# Patient Record
Sex: Female | Born: 1990 | Race: Black or African American | Hispanic: No | Marital: Single | Smoking: Current some day smoker
Health system: Southern US, Community
[De-identification: ages and names within clinical notes are randomized; demographics above are authoritative.]

---

## 2009-08-28 ENCOUNTER — Emergency Department (HOSPITAL_COMMUNITY): Admission: EM | Admit: 2009-08-28 | Discharge: 2009-08-28 | Payer: Self-pay | Admitting: Emergency Medicine

## 2009-11-10 ENCOUNTER — Emergency Department (HOSPITAL_COMMUNITY): Admission: EM | Admit: 2009-11-10 | Discharge: 2009-11-11 | Payer: Self-pay | Admitting: Emergency Medicine

## 2009-12-30 ENCOUNTER — Ambulatory Visit: Payer: Self-pay | Admitting: Obstetrics and Gynecology

## 2009-12-30 ENCOUNTER — Inpatient Hospital Stay (HOSPITAL_COMMUNITY): Admission: AD | Admit: 2009-12-30 | Discharge: 2009-12-30 | Payer: Self-pay | Admitting: Family Medicine

## 2010-01-03 ENCOUNTER — Inpatient Hospital Stay (HOSPITAL_COMMUNITY): Admission: AD | Admit: 2010-01-03 | Discharge: 2010-01-03 | Payer: Self-pay | Admitting: Obstetrics & Gynecology

## 2010-01-03 ENCOUNTER — Ambulatory Visit: Payer: Self-pay | Admitting: Advanced Practice Midwife

## 2010-02-02 ENCOUNTER — Inpatient Hospital Stay (HOSPITAL_COMMUNITY): Admission: RE | Admit: 2010-02-02 | Discharge: 2010-02-02 | Payer: Self-pay | Admitting: Family Medicine

## 2010-02-03 ENCOUNTER — Inpatient Hospital Stay (HOSPITAL_COMMUNITY): Admission: AD | Admit: 2010-02-03 | Discharge: 2010-02-03 | Payer: Self-pay | Admitting: Family Medicine

## 2010-02-03 ENCOUNTER — Ambulatory Visit: Payer: Self-pay | Admitting: Obstetrics and Gynecology

## 2010-02-04 ENCOUNTER — Inpatient Hospital Stay (HOSPITAL_COMMUNITY): Admission: AD | Admit: 2010-02-04 | Discharge: 2010-02-11 | Payer: Self-pay | Admitting: Obstetrics & Gynecology

## 2010-02-04 ENCOUNTER — Ambulatory Visit: Payer: Self-pay | Admitting: Advanced Practice Midwife

## 2010-09-04 LAB — CBC
HCT: 34.7 % — ABNORMAL LOW (ref 36.0–46.0)
HCT: 35.6 % — ABNORMAL LOW (ref 36.0–46.0)
Hemoglobin: 12.1 g/dL (ref 12.0–15.0)
MCH: 30 pg (ref 26.0–34.0)
MCH: 30.3 pg (ref 26.0–34.0)
MCHC: 33.5 g/dL (ref 30.0–36.0)
MCHC: 33.9 g/dL (ref 30.0–36.0)
MCV: 89.4 fL (ref 78.0–100.0)
MCV: 89.5 fL (ref 78.0–100.0)
Platelets: 264 10*3/uL (ref 150–400)
Platelets: 307 10*3/uL (ref 150–400)
RBC: 3.87 MIL/uL (ref 3.87–5.11)
RDW: 13.1 % (ref 11.5–15.5)
RDW: 13.1 % (ref 11.5–15.5)
WBC: 12.1 10*3/uL — ABNORMAL HIGH (ref 4.0–10.5)

## 2010-09-04 LAB — FETAL FIBRONECTIN: Fetal Fibronectin: POSITIVE — AB

## 2010-09-04 LAB — RPR: RPR Ser Ql: NONREACTIVE

## 2010-09-04 LAB — RUBELLA SCREEN: Rubella: 11.1 IU/mL — ABNORMAL HIGH

## 2010-09-05 LAB — URINALYSIS, ROUTINE W REFLEX MICROSCOPIC
Bilirubin Urine: NEGATIVE
Hgb urine dipstick: NEGATIVE
pH: 7 (ref 5.0–8.0)

## 2010-09-05 LAB — URINE MICROSCOPIC-ADD ON

## 2010-09-06 LAB — URINALYSIS, ROUTINE W REFLEX MICROSCOPIC
Bilirubin Urine: NEGATIVE
Glucose, UA: NEGATIVE mg/dL
Ketones, ur: 15 mg/dL — AB
Protein, ur: NEGATIVE mg/dL
Specific Gravity, Urine: 1.025 (ref 1.005–1.030)
Urobilinogen, UA: 0.2 mg/dL (ref 0.0–1.0)
pH: 6.5 (ref 5.0–8.0)

## 2010-09-06 LAB — WET PREP, GENITAL: Clue Cells Wet Prep HPF POC: NONE SEEN

## 2010-09-07 LAB — URINALYSIS, ROUTINE W REFLEX MICROSCOPIC
Glucose, UA: NEGATIVE mg/dL
Hgb urine dipstick: NEGATIVE
Leukocytes, UA: NEGATIVE
Nitrite: NEGATIVE
Specific Gravity, Urine: 1.035 — ABNORMAL HIGH (ref 1.005–1.030)
Urobilinogen, UA: 1 mg/dL (ref 0.0–1.0)
pH: 7.5 (ref 5.0–8.0)

## 2010-09-07 LAB — URINE MICROSCOPIC-ADD ON

## 2010-09-14 LAB — URINALYSIS, ROUTINE W REFLEX MICROSCOPIC
Bilirubin Urine: NEGATIVE
Glucose, UA: NEGATIVE mg/dL
Ketones, ur: NEGATIVE mg/dL
Nitrite: NEGATIVE
Specific Gravity, Urine: 1.027 (ref 1.005–1.030)
Urobilinogen, UA: 0.2 mg/dL (ref 0.0–1.0)
pH: 7 (ref 5.0–8.0)

## 2010-09-14 LAB — URINE CULTURE

## 2010-09-14 LAB — URINE MICROSCOPIC-ADD ON

## 2011-01-27 ENCOUNTER — Inpatient Hospital Stay (INDEPENDENT_AMBULATORY_CARE_PROVIDER_SITE_OTHER)
Admission: RE | Admit: 2011-01-27 | Discharge: 2011-01-27 | Disposition: A | Discharge status: Home / Self Care | Payer: Medicaid Other | Source: Ambulatory Visit | Attending: Emergency Medicine | Admitting: Emergency Medicine

## 2011-01-27 DIAGNOSIS — J069 Acute upper respiratory infection, unspecified: Secondary | ICD-10-CM

## 2011-03-24 ENCOUNTER — Emergency Department (HOSPITAL_COMMUNITY)
Admission: EM | Admit: 2011-03-24 | Discharge: 2011-03-25 | Disposition: A | Discharge status: Home / Self Care | Payer: Self-pay | Attending: Emergency Medicine | Admitting: Emergency Medicine

## 2011-03-24 DIAGNOSIS — R55 Syncope and collapse: Secondary | ICD-10-CM | POA: Insufficient documentation

## 2011-03-24 DIAGNOSIS — R42 Dizziness and giddiness: Secondary | ICD-10-CM | POA: Insufficient documentation

## 2011-03-25 ENCOUNTER — Emergency Department (HOSPITAL_COMMUNITY)
Admission: EM | Admit: 2011-03-25 | Discharge: 2011-03-25 | Disposition: A | Discharge status: Home / Self Care | Payer: Self-pay | Attending: Emergency Medicine | Admitting: Emergency Medicine

## 2011-03-25 DIAGNOSIS — J020 Streptococcal pharyngitis: Secondary | ICD-10-CM | POA: Insufficient documentation

## 2011-03-25 DIAGNOSIS — R42 Dizziness and giddiness: Secondary | ICD-10-CM | POA: Insufficient documentation

## 2011-03-25 DIAGNOSIS — J351 Hypertrophy of tonsils: Secondary | ICD-10-CM | POA: Insufficient documentation

## 2011-03-25 LAB — URINALYSIS, ROUTINE W REFLEX MICROSCOPIC
Bilirubin Urine: NEGATIVE
Bilirubin Urine: NEGATIVE
Glucose, UA: NEGATIVE mg/dL
Ketones, ur: NEGATIVE mg/dL
Nitrite: NEGATIVE
Protein, ur: NEGATIVE mg/dL
Specific Gravity, Urine: 1.028 (ref 1.005–1.030)
pH: 6 (ref 5.0–8.0)

## 2011-03-25 LAB — CBC
HCT: 38 % (ref 36.0–46.0)
Hemoglobin: 12.8 g/dL (ref 12.0–15.0)
MCH: 28.8 pg (ref 26.0–34.0)
MCV: 85.4 fL (ref 78.0–100.0)
RBC: 4.45 MIL/uL (ref 3.87–5.11)

## 2011-03-25 LAB — POCT I-STAT, CHEM 8
BUN: 13 mg/dL (ref 6–23)
Chloride: 104 mEq/L (ref 96–112)
Creatinine, Ser: 0.9 mg/dL (ref 0.50–1.10)
Sodium: 141 mEq/L (ref 135–145)
TCO2: 24 mmol/L (ref 0–100)

## 2011-03-25 LAB — URINE MICROSCOPIC-ADD ON

## 2011-03-25 LAB — POCT PREGNANCY, URINE: Preg Test, Ur: NEGATIVE

## 2011-05-31 ENCOUNTER — Emergency Department (HOSPITAL_COMMUNITY)
Admission: EM | Admit: 2011-05-31 | Discharge: 2011-05-31 | Disposition: A | Discharge status: Home / Self Care | Payer: Self-pay | Attending: Emergency Medicine | Admitting: Emergency Medicine

## 2011-05-31 ENCOUNTER — Encounter: Payer: Self-pay | Admitting: *Deleted

## 2011-05-31 DIAGNOSIS — N760 Acute vaginitis: Secondary | ICD-10-CM | POA: Insufficient documentation

## 2011-05-31 DIAGNOSIS — R10819 Abdominal tenderness, unspecified site: Secondary | ICD-10-CM | POA: Insufficient documentation

## 2011-05-31 DIAGNOSIS — A499 Bacterial infection, unspecified: Secondary | ICD-10-CM | POA: Insufficient documentation

## 2011-05-31 DIAGNOSIS — B9689 Other specified bacterial agents as the cause of diseases classified elsewhere: Secondary | ICD-10-CM | POA: Insufficient documentation

## 2011-05-31 LAB — POCT PREGNANCY, URINE: Preg Test, Ur: NEGATIVE

## 2011-05-31 LAB — URINALYSIS, ROUTINE W REFLEX MICROSCOPIC
Bilirubin Urine: NEGATIVE
Hgb urine dipstick: NEGATIVE
Nitrite: NEGATIVE
Specific Gravity, Urine: 1.017 (ref 1.005–1.030)
Urobilinogen, UA: 0.2 mg/dL (ref 0.0–1.0)
pH: 6 (ref 5.0–8.0)

## 2011-05-31 LAB — WET PREP, GENITAL
Trich, Wet Prep: NONE SEEN
Yeast Wet Prep HPF POC: NONE SEEN

## 2011-05-31 MED ORDER — ONDANSETRON HCL 4 MG PO TABS: 4.0000 mg | ORAL_TABLET | Freq: Four times a day (QID) | ORAL | Status: DC

## 2011-05-31 MED ORDER — OXYCODONE-ACETAMINOPHEN 5-325 MG PO TABS: 1.0000 | ORAL_TABLET | Freq: Four times a day (QID) | ORAL | Status: AC | PRN

## 2011-05-31 MED ORDER — ONDANSETRON 8 MG PO TBDP
8.0000 mg | ORAL_TABLET | Freq: Once | ORAL | Status: AC
  Administered 2011-05-31: 8 mg via ORAL
  Filled 2011-05-31: qty 1

## 2011-05-31 MED ORDER — METRONIDAZOLE 500 MG PO TABS: 500.0000 mg | ORAL_TABLET | Freq: Two times a day (BID) | ORAL | Status: AC

## 2011-05-31 NOTE — ED Provider Notes (Signed)
History     CSN: [id number] Arrival date & time: 05/31/2011  1:55 PM   First MD Initiated Contact with Patient 05/31/11 1644      Chief Complaint  Patient presents with  . Abdominal Pain    Pt c/o LLQ and RLQ abd pain x's 1 week. Pt also c/o lower back pain.  . Nausea    pt reports nausea, denies vomiting.     (Consider location/radiation/quality/duration/timing/severity/associated sxs/prior treatment) Patient is a 20 y.o. female presenting with abdominal pain. The history is provided by the patient.  Abdominal Pain The primary symptoms of the illness include abdominal pain and nausea. The primary symptoms of the illness do not include fever, fatigue, shortness of breath, vomiting, diarrhea, hematemesis, hematochezia, dysuria, vaginal discharge or vaginal bleeding. The current episode started yesterday. The onset of the illness was gradual. The problem has not changed since onset. The patient states that she believes she is currently not pregnant. The patient has not had a change in bowel habit. Symptoms associated with the illness do not include chills, anorexia, diaphoresis, heartburn, constipation, urgency, hematuria, frequency or back pain.   Pt presented to ED for bilateral lower quadrant pain that comes and goes with nausea without vomiting for 1 week,. History reviewed. No pertinent past medical history.  History reviewed. No pertinent past surgical history.  History reviewed. No pertinent family history.  History  Substance Use Topics  . Smoking status: Passive Smoker    Types: Cigars  . Smokeless tobacco: Not on file  . Alcohol Use:     OB History    Grav Para Term Preterm Abortions TAB SAB Ect Mult Living                  Review of Systems  Constitutional: Negative for fever, chills, diaphoresis and fatigue.  Respiratory: Negative for shortness of breath.   Gastrointestinal: Positive for nausea and abdominal pain. Negative for heartburn, vomiting, diarrhea,  constipation, hematochezia, anorexia and hematemesis.  Genitourinary: Negative for dysuria, urgency, frequency, hematuria, vaginal bleeding and vaginal discharge.  Musculoskeletal: Negative for back pain.  All other systems reviewed and are negative.    Allergies  Review of patient's allergies indicates no known allergies.  Home Medications  No current outpatient prescriptions on file.  BP 113/64  Pulse 81  Temp(Src) 98.7 F (37.1 C) (Oral)  Resp 12  Wt 165 lb (74.844 kg)  SpO2 100%  LMP 05/18/2011  Physical Exam  Nursing note and vitals reviewed. Constitutional: She appears well-developed and well-nourished.  HENT:  Head: Normocephalic and atraumatic.  Eyes: Conjunctivae are normal. Pupils are equal, round, and reactive to light.  Neck: Trachea normal, normal range of motion and full passive range of motion without pain. Neck supple.  Cardiovascular: Normal rate, regular rhythm and normal pulses.   Pulmonary/Chest: Effort normal and breath sounds normal. Chest wall is not dull to percussion. She exhibits no tenderness, no crepitus, no edema, no deformity and no retraction.  Abdominal: Soft. Normal appearance and bowel sounds are normal. She exhibits no distension. There is no tenderness. There is no guarding.  Genitourinary: Rectum normal and uterus normal. Cervix exhibits no motion tenderness, no discharge and no friability. Right adnexum displays tenderness. Right adnexum displays no mass and no fullness. Left adnexum displays tenderness. Left adnexum displays no mass and no fullness.  Musculoskeletal: Normal range of motion.  Neurological: She is alert. She has normal strength.  Skin: Skin is warm, dry and intact.  Psychiatric: She has a normal  mood and affect. Her speech is normal and behavior is normal. Judgment and thought content normal. Cognition and memory are normal.    ED Course  Procedures (including critical care time)  Labs Reviewed  WET PREP, GENITAL -  Abnormal; Notable for the following:    WBC, Wet Prep HPF POC MODERATE (*)    All other components within normal limits  URINALYSIS, ROUTINE W REFLEX MICROSCOPIC  POCT PREGNANCY, URINE  POCT PREGNANCY, URINE  GC/CHLAMYDIA PROBE AMP, GENITAL   No results found.   No diagnosis found.    MDM  Wet prep showed WBC, pt has been pain and nausea free since back in ED rooms.        Dorthula Matas, PA 05/31/11 [phone]

## 2011-05-31 NOTE — ED Notes (Signed)
Pt reports having LLQ and RLQ abdominal pain and lower back pain for approximately a week. Pt reports nausea with the pain, denies vomiting, burning on urination, vaginal discharge and vaginal odor. Pt does state that she thinks she had a UTI approximately 3 weeks ago, but doesn't think that is what she has now. Pt alert and oriented x 4, neuro intact. Pt is sexually active and states she uses protection. Pt is not on her period at this time.

## 2011-06-01 LAB — GC/CHLAMYDIA PROBE AMP, GENITAL: GC Probe Amp, Genital: NEGATIVE

## 2011-06-01 NOTE — ED Provider Notes (Signed)
Medical screening examination/treatment/procedure(s) were performed by non-physician practitioner and as supervising physician I was immediately available for consultation/collaboration.  Nicholes Stairs, MD 06/01/11 [phone]

## 2011-06-07 ENCOUNTER — Encounter (HOSPITAL_COMMUNITY): Payer: Self-pay | Admitting: Emergency Medicine

## 2011-06-07 ENCOUNTER — Emergency Department (HOSPITAL_COMMUNITY)
Admission: EM | Admit: 2011-06-07 | Discharge: 2011-06-08 | Disposition: A | Discharge status: Home / Self Care | Payer: Self-pay | Attending: Emergency Medicine | Admitting: Emergency Medicine

## 2011-06-07 ENCOUNTER — Emergency Department (HOSPITAL_COMMUNITY): Payer: Self-pay

## 2011-06-07 DIAGNOSIS — Z79899 Other long term (current) drug therapy: Secondary | ICD-10-CM | POA: Insufficient documentation

## 2011-06-07 DIAGNOSIS — R3915 Urgency of urination: Secondary | ICD-10-CM | POA: Insufficient documentation

## 2011-06-07 DIAGNOSIS — R110 Nausea: Secondary | ICD-10-CM

## 2011-06-07 DIAGNOSIS — R10819 Abdominal tenderness, unspecified site: Secondary | ICD-10-CM | POA: Insufficient documentation

## 2011-06-07 DIAGNOSIS — R112 Nausea with vomiting, unspecified: Secondary | ICD-10-CM | POA: Insufficient documentation

## 2011-06-07 DIAGNOSIS — R109 Unspecified abdominal pain: Secondary | ICD-10-CM | POA: Insufficient documentation

## 2011-06-07 DIAGNOSIS — F17200 Nicotine dependence, unspecified, uncomplicated: Secondary | ICD-10-CM | POA: Insufficient documentation

## 2011-06-07 LAB — URINE MICROSCOPIC-ADD ON

## 2011-06-07 LAB — URINALYSIS, ROUTINE W REFLEX MICROSCOPIC
Bilirubin Urine: NEGATIVE
Glucose, UA: NEGATIVE mg/dL
Hgb urine dipstick: NEGATIVE
Specific Gravity, Urine: 1.02 (ref 1.005–1.030)

## 2011-06-07 LAB — PREGNANCY, URINE: Preg Test, Ur: NEGATIVE

## 2011-06-07 MED ORDER — SODIUM CHLORIDE 0.9 % IV SOLN
INTRAVENOUS | Status: DC
  Administered 2011-06-08: 01:00:00 via INTRAVENOUS

## 2011-06-07 MED ORDER — ONDANSETRON HCL 4 MG/2ML IJ SOLN
4.0000 mg | Freq: Once | INTRAMUSCULAR | Status: AC
  Administered 2011-06-07: 4 mg via INTRAVENOUS
  Filled 2011-06-07: qty 2

## 2011-06-07 MED ORDER — SODIUM CHLORIDE 0.9 % IV BOLUS (SEPSIS)
500.0000 mL | Freq: Once | INTRAVENOUS | Status: AC
  Administered 2011-06-07: 1000 mL via INTRAVENOUS

## 2011-06-07 NOTE — ED Notes (Signed)
Patient transported to X-ray 

## 2011-06-07 NOTE — ED Provider Notes (Signed)
History     CSN: [id number] Arrival date & time: 06/07/2011  9:02 PM   First MD Initiated Contact with Patient 06/07/11 2318      Chief Complaint  Patient presents with  . Abdominal Pain    (Consider location/radiation/quality/duration/timing/severity/associated sxs/prior treatment) Patient is a 20 y.o. female presenting with abdominal pain. The history is provided by the patient.  Abdominal Pain The primary symptoms of the illness include abdominal pain, nausea and vomiting. The primary symptoms of the illness do not include fever, fatigue, shortness of breath, diarrhea, hematemesis, dysuria or vaginal discharge. The current episode started more than 2 days ago. The onset of the illness was gradual. The problem has not changed since onset. The abdominal pain radiates to the RUQ and LUQ. The severity of the abdominal pain is 6/10. The abdominal pain is relieved by nothing. The abdominal pain is exacerbated by vomiting.  Nausea began more than 1 week ago. The nausea is associated with eating. The nausea is exacerbated by food.  The vomiting began more than 2 days ago. The emesis contains stomach contents.  The patient states that she believes she is currently not pregnant. The patient has not had a change in bowel habit. Additional symptoms associated with the illness include urgency. Symptoms associated with the illness do not include chills, heartburn or frequency. Significant associated medical issues do not include PUD, GERD or inflammatory bowel disease.   She was seen here about a week ago and diagnosed with nonspecific vaginitis. She was prescribed Flagyl and Percocet but they have not helped.  History reviewed. No pertinent past medical history.  History reviewed. No pertinent past surgical history.  History reviewed. No pertinent family history.  History  Substance Use Topics  . Smoking status: Passive Smoker    Types: Cigars  . Smokeless tobacco: Not on file  . Alcohol  Use: No    OB History    Grav Para Term Preterm Abortions TAB SAB Ect Mult Living                  Review of Systems  Constitutional: Negative for fever, chills and fatigue.  Respiratory: Negative for shortness of breath.   Gastrointestinal: Positive for nausea, vomiting and abdominal pain. Negative for heartburn, diarrhea and hematemesis.  Genitourinary: Positive for urgency. Negative for dysuria, frequency and vaginal discharge.  All other systems reviewed and are negative.    Allergies  Review of patient's allergies indicates no known allergies.  Home Medications   Current Outpatient Rx  Name Route Sig Dispense Refill  . METRONIDAZOLE 500 MG PO TABS Oral Take 1 tablet (500 mg total) by mouth 2 (two) times daily. 14 tablet 0  . OXYCODONE-ACETAMINOPHEN 5-325 MG PO TABS Oral Take 1 tablet by mouth every 6 (six) hours as needed for pain. 15 tablet 0  . PROMETHAZINE HCL 25 MG PO TABS Oral Take 1 tablet (25 mg total) by mouth every 6 (six) hours as needed for nausea. 30 tablet 0    BP 108/67  Pulse 65  Temp(Src) 98.6 F (37 C) (Oral)  Resp 16  SpO2 100%  LMP 05/18/2011  Physical Exam  Constitutional: She is oriented to person, place, and time. She appears well-developed and well-nourished.  HENT:  Head: Normocephalic and atraumatic.  Eyes: Conjunctivae and EOM are normal. Pupils are equal, round, and reactive to light. Right eye exhibits no discharge. Left eye exhibits no discharge.  Neck: Normal range of motion. Neck supple.  Cardiovascular: Normal rate and regular  rhythm.   Pulmonary/Chest: Effort normal and breath sounds normal.  Abdominal: Soft. Bowel sounds are normal. She exhibits no distension. There is tenderness (Mild diffuse tenderness without localization. No mass. No hepatosplenomegaly. Bowel sounds are normal).  Musculoskeletal: Normal range of motion.  Neurological: She is alert and oriented to person, place, and time. No cranial nerve deficit. She exhibits  normal muscle tone. Coordination normal.  Skin: Skin is warm and dry.  Psychiatric: She has a normal mood and affect. Her behavior is normal. Judgment and thought content normal.    ED Course  Procedures (including critical care time)  Labs Reviewed  URINALYSIS, ROUTINE W REFLEX MICROSCOPIC - Abnormal; Notable for the following:    Leukocytes, UA SMALL (*)    All other components within normal limits  URINE MICROSCOPIC-ADD ON - Abnormal; Notable for the following:    Squamous Epithelial / LPF FEW (*)    Bacteria, UA MANY (*)    All other components within normal limits  DIFFERENTIAL - Abnormal; Notable for the following:    Neutrophils Relative 40 (*)    Lymphocytes Relative 47 (*)    All other components within normal limits  COMPREHENSIVE METABOLIC PANEL - Abnormal; Notable for the following:    Albumin 3.3 (*)    Total Bilirubin 0.2 (*)    GFR calc non Af Amer 74 (*)    GFR calc Af Amer 86 (*)    All other components within normal limits  CBC  PREGNANCY, URINE   Dg Abd Acute W/chest  06/08/2011  *RADIOLOGY REPORT*  Clinical Data: Abdominal pain, vomiting  ACUTE ABDOMEN SERIES (ABDOMEN 2 VIEW & CHEST 1 VIEW)  Comparison: None.  Findings: Lungs are clear. No pleural effusion or pneumothorax.  Cardiomediastinal silhouette is within normal limits.  Nonobstructive bowel gas pattern.  No evidence of free air under the diaphragm on the upright view.  Visualized osseous structures are within normal limits.  IMPRESSION: No evidence of acute cardiopulmonary disease.  No evidence of small bowel obstruction or free air.  Original Report Authenticated By: Charline Bills, M.D.   ED treatment: IV fluids, and Zofran. At this time. Patient is improved, and feels comfortable- 02:57  1. Abdominal pain   2. Nausea       MDM  Patient is improved with treatment given in the emergency department. Her evaluation tonight is reassuring for lack of significant intra-abdominal processes. I doubt  occult infection, colitis, metabolic instability or progressive disease.        Flint Melter, MD 06/08/11 [phone]

## 2011-06-07 NOTE — ED Notes (Signed)
Pt states she is having abd pain, back pain, headache, sore throat, nausea and vomiting, general fatigue  Sxs started over a week ago  Was seen here last Monday  Sxs continue to get worse Pt states she was given flagyl and percocet and has not been able to keep it down

## 2011-06-08 LAB — DIFFERENTIAL
Basophils Relative: 1 % (ref 0–1)
Lymphocytes Relative: 47 % — ABNORMAL HIGH (ref 12–46)
Lymphs Abs: 2.8 10*3/uL (ref 0.7–4.0)
Monocytes Relative: 10 % (ref 3–12)
Neutro Abs: 2.4 10*3/uL (ref 1.7–7.7)
Neutrophils Relative %: 40 % — ABNORMAL LOW (ref 43–77)

## 2011-06-08 LAB — COMPREHENSIVE METABOLIC PANEL
AST: 14 U/L (ref 0–37)
Albumin: 3.3 g/dL — ABNORMAL LOW (ref 3.5–5.2)
Alkaline Phosphatase: 47 U/L (ref 39–117)
BUN: 13 mg/dL (ref 6–23)
CO2: 23 mEq/L (ref 19–32)
Chloride: 106 mEq/L (ref 96–112)
GFR calc non Af Amer: 74 mL/min — ABNORMAL LOW (ref >90)
Potassium: 3.6 mEq/L (ref 3.5–5.1)
Total Bilirubin: 0.2 mg/dL — ABNORMAL LOW (ref 0.3–1.2)

## 2011-06-08 LAB — CBC
Platelets: 235 10*3/uL (ref 150–400)
RBC: 4.35 MIL/uL (ref 3.87–5.11)
RDW: 12.8 % (ref 11.5–15.5)
WBC: 5.9 10*3/uL (ref 4.0–10.5)

## 2011-06-08 MED ORDER — PROMETHAZINE HCL 25 MG PO TABS: 25.0000 mg | ORAL_TABLET | Freq: Four times a day (QID) | ORAL | Status: DC | PRN

## 2011-06-08 NOTE — ED Notes (Signed)
Lab stated that they are unable to find blood specimen. Lab tech will draw additional blood.

## 2011-06-08 NOTE — ED Notes (Signed)
Pt denies nausea at this time. Resting quietly.

## 2011-06-08 NOTE — ED Notes (Signed)
Repeat blood work drawn and sent to lab.

## 2011-06-17 ENCOUNTER — Emergency Department (HOSPITAL_COMMUNITY)
Admission: EM | Admit: 2011-06-17 | Discharge: 2011-06-17 | Disposition: A | Discharge status: Home / Self Care | Payer: Self-pay | Attending: Emergency Medicine | Admitting: Emergency Medicine

## 2011-06-17 ENCOUNTER — Encounter (HOSPITAL_COMMUNITY): Payer: Self-pay | Admitting: *Deleted

## 2011-06-17 DIAGNOSIS — R5383 Other fatigue: Secondary | ICD-10-CM | POA: Insufficient documentation

## 2011-06-17 DIAGNOSIS — R05 Cough: Secondary | ICD-10-CM | POA: Insufficient documentation

## 2011-06-17 DIAGNOSIS — E860 Dehydration: Secondary | ICD-10-CM | POA: Insufficient documentation

## 2011-06-17 DIAGNOSIS — R112 Nausea with vomiting, unspecified: Secondary | ICD-10-CM | POA: Insufficient documentation

## 2011-06-17 DIAGNOSIS — R197 Diarrhea, unspecified: Secondary | ICD-10-CM | POA: Insufficient documentation

## 2011-06-17 DIAGNOSIS — R059 Cough, unspecified: Secondary | ICD-10-CM | POA: Insufficient documentation

## 2011-06-17 DIAGNOSIS — IMO0001 Reserved for inherently not codable concepts without codable children: Secondary | ICD-10-CM | POA: Insufficient documentation

## 2011-06-17 DIAGNOSIS — R509 Fever, unspecified: Secondary | ICD-10-CM | POA: Insufficient documentation

## 2011-06-17 DIAGNOSIS — R5381 Other malaise: Secondary | ICD-10-CM | POA: Insufficient documentation

## 2011-06-17 LAB — CBC
HCT: 39.9 % (ref 36.0–46.0)
MCHC: 34.6 g/dL (ref 30.0–36.0)
MCV: 84.4 fL (ref 78.0–100.0)
RDW: 12.6 % (ref 11.5–15.5)

## 2011-06-17 LAB — DIFFERENTIAL
Basophils Absolute: 0 10*3/uL (ref 0.0–0.1)
Basophils Relative: 0 % (ref 0–1)
Eosinophils Relative: 1 % (ref 0–5)
Monocytes Absolute: 0.7 10*3/uL (ref 0.1–1.0)

## 2011-06-17 LAB — POCT I-STAT, CHEM 8
Calcium, Ion: 1.1 mmol/L — ABNORMAL LOW (ref 1.12–1.32)
HCT: 42 % (ref 36.0–46.0)
Hemoglobin: 14.3 g/dL (ref 12.0–15.0)
TCO2: 22 mmol/L (ref 0–100)

## 2011-06-17 MED ORDER — ONDANSETRON HCL 4 MG/2ML IJ SOLN
4.0000 mg | Freq: Once | INTRAMUSCULAR | Status: AC
  Administered 2011-06-17: 4 mg via INTRAVENOUS
  Filled 2011-06-17: qty 2

## 2011-06-17 MED ORDER — MORPHINE SULFATE 4 MG/ML IJ SOLN
4.0000 mg | Freq: Once | INTRAMUSCULAR | Status: AC
  Administered 2011-06-17: 4 mg via INTRAVENOUS
  Filled 2011-06-17: qty 1

## 2011-06-17 MED ORDER — HYDROCODONE-ACETAMINOPHEN 5-325 MG PO TABS: 1.0000 | ORAL_TABLET | Freq: Four times a day (QID) | ORAL | Status: AC | PRN

## 2011-06-17 MED ORDER — SODIUM CHLORIDE 0.9 % IV BOLUS (SEPSIS)
1000.0000 mL | Freq: Once | INTRAVENOUS | Status: AC
  Administered 2011-06-17: 1000 mL via INTRAVENOUS

## 2011-06-17 MED ORDER — ONDANSETRON 4 MG PO TBDP: 4.0000 mg | ORAL_TABLET | Freq: Four times a day (QID) | ORAL | Status: AC | PRN

## 2011-06-17 NOTE — ED Provider Notes (Signed)
History     CSN: [id number]  Arrival date & time 06/17/11  1306   First MD Initiated Contact with Patient 06/17/11 1347      Chief Complaint  Patient presents with  . Generalized Body Aches  . Sore Throat    (Consider location/radiation/quality/duration/timing/severity/associated sxs/prior treatment) Patient is a 20 y.o. female presenting with vomiting and diarrhea. The history is provided by the patient.  Emesis  This is a new problem. The current episode started more than 2 days ago. The problem occurs 5 to 10 times per day. The problem has not changed since onset.The emesis has an appearance of stomach contents and bilious material. The maximum temperature recorded prior to her arrival was 100 to 100.9 F. Associated symptoms include abdominal pain (cramping ), chills, cough, diarrhea, a fever, headaches, myalgias and sweats. Pertinent negatives include no arthralgias and no URI.  Diarrhea The primary symptoms include fever, fatigue, abdominal pain (cramping ), nausea, vomiting, diarrhea and myalgias. Primary symptoms do not include weight loss, melena, hematemesis, jaundice, hematochezia, dysuria, arthralgias or rash. The illness began 3 to 5 days ago. The onset was gradual. The problem has not changed since onset. The illness is also significant for chills. Associated medical issues do not include inflammatory bowel disease, GERD, gallstones, liver disease, alcohol abuse, PUD, gastric bypass, bowel resection, irritable bowel syndrome, hemorrhoids or diverticulitis.    History reviewed. No pertinent past medical history.  History reviewed. No pertinent past surgical history.  No family history on file.  History  Substance Use Topics  . Smoking status: Passive Smoker    Types: Cigars  . Smokeless tobacco: Not on file  . Alcohol Use: No    OB History    Grav Para Term Preterm Abortions TAB SAB Ect Mult Living                  Review of Systems  Constitutional: Positive  for fever, chills and fatigue. Negative for weight loss.  Respiratory: Positive for cough.   Gastrointestinal: Positive for nausea, vomiting, abdominal pain (cramping ) and diarrhea. Negative for melena, hematochezia, hematemesis and jaundice.  Genitourinary: Negative for dysuria.  Musculoskeletal: Positive for myalgias. Negative for arthralgias.  Skin: Negative for rash.  Neurological: Positive for headaches.    Allergies  Review of patient's allergies indicates no known allergies.  Home Medications   Current Outpatient Rx  Name Route Sig Dispense Refill  . OXYCODONE-ACETAMINOPHEN 5-325 MG PO TABS Oral Take 1 tablet by mouth every 4 (four) hours as needed. PAIN     . PROMETHAZINE HCL 25 MG PO TABS Oral Take 25 mg by mouth every 6 (six) hours as needed. NAUSEA       BP 100/59  Pulse 105  Temp(Src) 100.2 F (37.9 C) (Oral)  Resp 18  Ht 5\' 3"  (1.6 m)  Wt 165 lb (74.844 kg)  BMI 29.23 kg/m2  SpO2 100%  LMP 05/18/2011  Physical Exam  ED Course  Procedures (including critical care time)  Labs Reviewed  DIFFERENTIAL - Abnormal; Notable for the following:    Neutrophils Relative 84 (*)    Lymphocytes Relative 7 (*)    Lymphs Abs 0.6 (*)    All other components within normal limits  POCT I-STAT, CHEM 8 - Abnormal; Notable for the following:    Calcium, Ion 1.10 (*)    All other components within normal limits  CBC  I-STAT, CHEM 8   No results found.   No diagnosis found.    MDM  N/V/D  Patient rehydrated while in emergency department.  She states that she's not having any current abdominal pain.  Vital signs reviewed and is hemodynamically stable, in no acute distress, and agrees with plan to discharge home.  Patient states she will find a primary care physician to followup with and requests a resource guide upon discharge.        [place], [place] 06/17/11 [phone]

## 2011-06-17 NOTE — ED Notes (Signed)
Pt is to finish na bolus before d/c

## 2011-06-17 NOTE — ED Notes (Signed)
pts states she started to have sore throat and generalized body aches about 3 days ago. Pt states she is unable to keep po's down and diarrhea.

## 2011-06-17 NOTE — ED Provider Notes (Signed)
Medical screening examination/treatment/procedure(s) were performed by non-physician practitioner and as supervising physician I was immediately available for consultation/collaboration.   Dayton Bailiff, MD 06/17/11 1534

## 2011-06-22 ENCOUNTER — Encounter (HOSPITAL_COMMUNITY): Payer: Self-pay | Admitting: *Deleted

## 2011-06-22 ENCOUNTER — Emergency Department (INDEPENDENT_AMBULATORY_CARE_PROVIDER_SITE_OTHER)
Admission: EM | Admit: 2011-06-22 | Discharge: 2011-06-22 | Disposition: A | Discharge status: Home / Self Care | Payer: Self-pay | Source: Home / Self Care | Attending: Emergency Medicine | Admitting: Emergency Medicine

## 2011-06-22 DIAGNOSIS — E860 Dehydration: Secondary | ICD-10-CM

## 2011-06-22 DIAGNOSIS — J020 Streptococcal pharyngitis: Secondary | ICD-10-CM

## 2011-06-22 LAB — POCT RAPID STREP A: Streptococcus, Group A Screen (Direct): POSITIVE — AB

## 2011-06-22 MED ORDER — IBUPROFEN 600 MG PO TABS: 600.0000 mg | ORAL_TABLET | Freq: Four times a day (QID) | ORAL | Status: AC | PRN

## 2011-06-22 MED ORDER — ACETAMINOPHEN 325 MG PO TABS
ORAL_TABLET | ORAL | Status: AC
  Filled 2011-06-22: qty 2

## 2011-06-22 MED ORDER — ACETAMINOPHEN 325 MG PO TABS
650.0000 mg | ORAL_TABLET | Freq: Once | ORAL | Status: AC
  Administered 2011-06-22: 650 mg via ORAL

## 2011-06-22 MED ORDER — ONDANSETRON 4 MG PO TBDP
ORAL_TABLET | ORAL | Status: AC
  Filled 2011-06-22: qty 1

## 2011-06-22 MED ORDER — SUCRALFATE 1 GM/10ML PO SUSP: 1.0000 g | Freq: Four times a day (QID) | ORAL | Status: AC

## 2011-06-22 MED ORDER — ONDANSETRON 4 MG PO TBDP
4.0000 mg | ORAL_TABLET | Freq: Once | ORAL | Status: AC
  Administered 2011-06-22: 4 mg via ORAL

## 2011-06-22 MED ORDER — GI COCKTAIL ~~LOC~~
30.0000 mL | Freq: Once | ORAL | Status: AC
  Administered 2011-06-22: 30 mL via ORAL

## 2011-06-22 MED ORDER — DEXAMETHASONE SODIUM PHOSPHATE 10 MG/ML IJ SOLN
INTRAMUSCULAR | Status: AC
  Filled 2011-06-22: qty 1

## 2011-06-22 MED ORDER — GI COCKTAIL ~~LOC~~
ORAL | Status: AC
  Filled 2011-06-22: qty 30

## 2011-06-22 MED ORDER — FIRST-DUKES MOUTHWASH MT SUSP: 10.0000 mL | Freq: Four times a day (QID) | OROMUCOSAL | Status: DC | PRN

## 2011-06-22 MED ORDER — PENICILLIN G BENZATHINE 1200000 UNIT/2ML IM SUSP
INTRAMUSCULAR | Status: AC
  Filled 2011-06-22: qty 2

## 2011-06-22 MED ORDER — PENICILLIN G BENZATHINE 1200000 UNIT/2ML IM SUSP
1.2000 10*6.[IU] | Freq: Once | INTRAMUSCULAR | Status: AC
  Administered 2011-06-22: 1.2 10*6.[IU] via INTRAMUSCULAR

## 2011-06-22 MED ORDER — DEXAMETHASONE SODIUM PHOSPHATE 10 MG/ML IJ SOLN
10.0000 mg | Freq: Once | INTRAMUSCULAR | Status: AC
  Administered 2011-06-22: 10 mg via INTRAMUSCULAR

## 2011-06-22 NOTE — ED Notes (Signed)
Courtney Salinas  HAS  SYMPTOMS  OF  FEVER         BODY  ACHES    SORES  IN  MOUTH   FOR  ABOUT  1  WEEK  SHE  REPORTS  WAS  SEEN LAST  WEEK IN ER  FOR  FLU          SHE  REPORTS  PAIN ON SWALLOWING     ACHES    AND    VOMITING

## 2011-06-22 NOTE — ED Provider Notes (Addendum)
History     CSN: [id number]  Arrival date & time 06/22/11  1314   First MD Initiated Contact with Patient 06/22/11 1341      Chief Complaint  Patient presents with  . Fever   HPI Comments: Pt with severe ST x 2 weeks, states has gotten worse over past few days. Seen 3x in ED this month, first visit for lower abd pain which has resolved. Past 2 visits for abd pain, ST, N/V, bodyaches. Was slightly dehydrated and with low grade temp on most recent visit on 12/27, thought to have viral syndrome, bloodwork was wnl, sent home with zofran and norco.  Pt c/o continued fevers at home tmax 102 and worsening ST.  c/o continued LUQ pain with vomiting - denies any other abd pain- N/V 1-2x/day and a diffuse, constant HA that temporarily improved with IVF.Marland Kitchen States has been unable to eat or drink anything and HA has returned. C/o lightheadedness. No ear pain, sinus pain, rhinorrhea. No known sick contacts. Denies rash elsewhere, vaginal d/c. No h/o HIV, herpes.  Patient is a 21 y.o. female presenting with fever. The history is provided by the patient.  Fever Primary symptoms of the febrile illness include fever, fatigue, nausea and vomiting. Primary symptoms do not include diarrhea or rash.    History reviewed. No pertinent past medical history.  History reviewed. No pertinent past surgical history.  No family history on file.  History  Substance Use Topics  . Smoking status: Passive Smoker    Types: Cigars  . Smokeless tobacco: Not on file  . Alcohol Use: No    OB History    Grav Para Term Preterm Abortions TAB SAB Ect Mult Living                  Review of Systems  Constitutional: Positive for fever, chills and fatigue.  HENT: Positive for sore throat and trouble swallowing. Negative for voice change.   Respiratory: Negative.   Cardiovascular: Negative.   Gastrointestinal: Positive for nausea and vomiting. Negative for diarrhea and constipation.  Genitourinary: Negative.   Skin:  Negative for rash.  Neurological: Positive for weakness.    Allergies  Review of patient's allergies indicates no known allergies.  Home Medications   Current Outpatient Rx  Name Route Sig Dispense Refill  . ONDANSETRON 4 MG PO TBDP Oral Take 1 tablet (4 mg total) by mouth every 6 (six) hours as needed for nausea. 6 tablet 0  . PROMETHAZINE HCL 25 MG PO TABS Oral Take 25 mg by mouth every 6 (six) hours as needed. NAUSEA     . FIRST-DUKES MOUTHWASH MT SUSP Mouth/Throat Use as directed 10 mLs in the mouth or throat 4 (four) times daily as needed. Swish and spit do not swallow 237 mL 0  . HYDROCODONE-ACETAMINOPHEN 5-325 MG PO TABS Oral Take 1 tablet by mouth every 6 (six) hours as needed for pain. 15 tablet 0  . IBUPROFEN 600 MG PO TABS Oral Take 1 tablet (600 mg total) by mouth every 6 (six) hours as needed for pain. 30 tablet 0  . SUCRALFATE 1 GM/10ML PO SUSP Oral Take 10 mLs (1 g total) by mouth 4 (four) times daily. 240 mL 0    BP 108/68  Pulse 108  Temp(Src) 99.8 F (37.7 C) (Oral)  Resp 16  SpO2 99%  LMP 06/15/2011  Filed Vitals:   06/22/11 1448 06/22/11 1550  BP: 88/56 108/68  Pulse: 103 108  Temp: 101.6 F (38.7 C) 99.8  F (37.7 C)  TempSrc: Oral Oral  Resp: 16   SpO2: 100% 99%     Physical Exam  Nursing note and vitals reviewed. Constitutional: She is oriented to person, place, and time. She appears well-developed and well-nourished. She appears distressed.       Appears ill  HENT:  Head: Normocephalic and atraumatic. No trismus in the jaw.  Nose: Nose normal.  Mouth/Throat: Uvula is midline. Mucous membranes are dry. Oropharyngeal exudate, posterior oropharyngeal edema and posterior oropharyngeal erythema present.       Enlarged erythematous tonsils with extensive shaggy grey exudates. Slightly muffled voice. Airway patent  Eyes: Conjunctivae and EOM are normal.  Neck: Normal range of motion.  Cardiovascular: Regular rhythm, normal heart sounds and normal  pulses.  Tachycardia present.        CR < 2 sec. No tenting  Pulmonary/Chest: Effort normal and breath sounds normal.  Abdominal: Soft. Bowel sounds are normal. She exhibits no distension. There is no splenomegaly. There is tenderness in the left upper quadrant. There is no rebound, no guarding and no CVA tenderness.  Musculoskeletal: Normal range of motion.  Lymphadenopathy:    She has cervical adenopathy.  Neurological: She is alert and oriented to person, place, and time.  Skin: Skin is warm and dry.  Psychiatric: She has a normal mood and affect. Her behavior is normal. Judgment and thought content normal.    ED Course  Procedures (including critical care time)  Labs Reviewed  POCT RAPID STREP A ([facility] ONLY) - Abnormal; Notable for the following:    Streptococcus, Group A Screen (Direct) POSITIVE (*)    All other components within normal limits  POCT INFECTIOUS MONO SCREEN  LAB REPORT - SCANNED   No results found.   1. Strep pharyngitis   2. Dehydration       MDM  Previous chart, labs, imaging extensively reviewed. Pt states that abd pain today primarily in LUQ and present only when vomiting. Describes abd pain as "soreness" no other abd pain. abd tender gastric area. No other abd tenderness. Diarrhea has resolved. States has ad extremely poor po intake for past 5-6 days due to ST. Pt denies HIV, herpes RF and declined testing.    The patient was given the following meds in the [facility]:   Medications        ondansetron (ZOFRAN-ODT) disintegrating tablet 4 mg (4 mg Oral Given 06/22/11 1507)  gi cocktail (30 mL Oral Given 06/22/11 1507)  dexamethasone (DECADRON) injection 10 mg (10 mg Intramuscular Given 06/22/11 1513)  acetaminophen (TYLENOL) tablet 650 mg (650 mg Oral Given 06/22/11 1509)  penicillin g benzathine (BICILLIN LA) 1200000 UNIT/2ML injection 1.2 Million Units (1.2 Million Units Intramuscular Given 06/22/11 1620)     And had the following response: On  re-evaluation, Much improved. Tolerating po. Fever trending down, and is now normotensive on repeat VS. Discussed lab results with patient/parent. Pt has had ST for 2 weeks. Has severe pharyngitis here. Pt and parent do not want to go to ED for IVF. Wants to try oral rehydration at home. Agreed to return to ED if she is not getting better, or is unable to keep fluids down.  Will refer to ENT for re-eval and possible tonsillar removal. Dr. Annalee Genta on call.  Emphasized importance of f/u. Pt/parent agrees.    Luiz Blare, MD 06/23/11 1610  Luiz Blare, MD 06/26/11 1816

## 2011-07-29 ENCOUNTER — Emergency Department (HOSPITAL_COMMUNITY)
Admission: EM | Admit: 2011-07-29 | Discharge: 2011-07-30 | Disposition: A | Discharge status: Home / Self Care | Payer: Self-pay | Attending: Emergency Medicine | Admitting: Emergency Medicine

## 2011-07-29 ENCOUNTER — Encounter (HOSPITAL_COMMUNITY): Payer: Self-pay | Admitting: *Deleted

## 2011-07-29 DIAGNOSIS — R109 Unspecified abdominal pain: Secondary | ICD-10-CM | POA: Insufficient documentation

## 2011-07-29 DIAGNOSIS — M549 Dorsalgia, unspecified: Secondary | ICD-10-CM | POA: Insufficient documentation

## 2011-07-29 DIAGNOSIS — K529 Noninfective gastroenteritis and colitis, unspecified: Secondary | ICD-10-CM

## 2011-07-29 DIAGNOSIS — R110 Nausea: Secondary | ICD-10-CM | POA: Insufficient documentation

## 2011-07-29 DIAGNOSIS — K5289 Other specified noninfective gastroenteritis and colitis: Secondary | ICD-10-CM | POA: Insufficient documentation

## 2011-07-29 LAB — DIFFERENTIAL
Basophils Absolute: 0 10*3/uL (ref 0.0–0.1)
Lymphocytes Relative: 13 % (ref 12–46)
Neutro Abs: 6.5 10*3/uL (ref 1.7–7.7)
Neutrophils Relative %: 78 % — ABNORMAL HIGH (ref 43–77)

## 2011-07-29 LAB — POCT I-STAT, CHEM 8
BUN: 12 mg/dL (ref 6–23)
Calcium, Ion: 1.17 mmol/L (ref 1.12–1.32)
HCT: 42 % (ref 36.0–46.0)
TCO2: 26 mmol/L (ref 0–100)

## 2011-07-29 LAB — URINALYSIS, ROUTINE W REFLEX MICROSCOPIC
Bilirubin Urine: NEGATIVE
Hgb urine dipstick: NEGATIVE
Protein, ur: NEGATIVE mg/dL
Urobilinogen, UA: 0.2 mg/dL (ref 0.0–1.0)

## 2011-07-29 LAB — URINE MICROSCOPIC-ADD ON

## 2011-07-29 LAB — CBC
Platelets: 330 10*3/uL (ref 150–400)
RDW: 12.9 % (ref 11.5–15.5)
WBC: 8.3 10*3/uL (ref 4.0–10.5)

## 2011-07-29 MED ORDER — SODIUM CHLORIDE 0.9 % IV BOLUS (SEPSIS)
1000.0000 mL | Freq: Once | INTRAVENOUS | Status: AC
  Administered 2011-07-29: 1000 mL via INTRAVENOUS

## 2011-07-29 MED ORDER — ONDANSETRON HCL 4 MG/2ML IJ SOLN
4.0000 mg | Freq: Once | INTRAMUSCULAR | Status: AC
  Administered 2011-07-29: 4 mg via INTRAVENOUS
  Filled 2011-07-29: qty 2

## 2011-07-29 NOTE — ED Provider Notes (Signed)
History     CSN: [id number]  Arrival date & time 07/29/11  1757   First MD Initiated Contact with Patient 07/29/11 2101      Chief Complaint  Patient presents with  . Abdominal Pain  . Back Pain  . Nausea    (Consider location/radiation/quality/duration/timing/severity/associated sxs/prior treatment) Patient is a 21 y.o. female presenting with abdominal pain and back pain. The history is provided by the patient.  Abdominal Pain The primary symptoms of the illness include abdominal pain. The primary symptoms of the illness do not include fever, shortness of breath, nausea, vomiting, diarrhea, dysuria or vaginal discharge. The onset of the illness was sudden.  The patient states that she believes she is currently not pregnant. The patient has had a change in bowel habit. Additional symptoms associated with the illness include back pain. Symptoms associated with the illness do not include chills, constipation, urgency or frequency.  Back Pain  Associated symptoms include abdominal pain. Pertinent negatives include no chest pain, no fever, no dysuria and no weakness.    History reviewed. No pertinent past medical history.  History reviewed. No pertinent past surgical history.  No family history on file.  History  Substance Use Topics  . Smoking status: Passive Smoker    Types: Cigars  . Smokeless tobacco: Not on file  . Alcohol Use: No    OB History    Grav Para Term Preterm Abortions TAB SAB Ect Mult Living                  Review of Systems  Constitutional: Negative for fever and chills.  Respiratory: Negative for shortness of breath.   Cardiovascular: Negative for chest pain.  Gastrointestinal: Positive for abdominal pain. Negative for nausea, vomiting, diarrhea and constipation.  Genitourinary: Negative for dysuria, urgency, frequency, decreased urine volume and vaginal discharge.  Musculoskeletal: Positive for back pain. Negative for myalgias.  Skin: Negative for  color change.  Neurological: Negative for dizziness and weakness.    Allergies  Review of patient's allergies indicates no known allergies.  Home Medications   Current Outpatient Rx  Name Route Sig Dispense Refill  . ACETAMINOPHEN 500 MG PO TABS Oral Take 1,000 mg by mouth every 6 (six) hours as needed. For pain.      BP 107/69  Pulse 95  Temp(Src) 98.7 F (37.1 C) (Oral)  Resp 18  Ht 5\' 2"  (1.575 m)  Wt 165 lb (74.844 kg)  BMI 30.18 kg/m2  SpO2 100%  LMP 07/24/2011  Physical Exam  Constitutional: She appears well-developed and well-nourished.  HENT:  Head: Normocephalic.  Eyes: Pupils are equal, round, and reactive to light.  Neck: Normal range of motion.  Cardiovascular: Normal rate.   Pulmonary/Chest: Effort normal.  Abdominal: Soft. Bowel sounds are normal. She exhibits no distension. There is tenderness in the epigastric area and suprapubic area. There is no rebound.    Skin: Skin is warm and dry.  Psychiatric: She has a normal mood and affect.    ED Course  Procedures (including critical care time)  Labs Reviewed  URINALYSIS, ROUTINE W REFLEX MICROSCOPIC - Abnormal; Notable for the following:    APPearance CLOUDY (*)    Leukocytes, UA SMALL (*)    All other components within normal limits  DIFFERENTIAL - Abnormal; Notable for the following:    Neutrophils Relative 78 (*)    All other components within normal limits  URINE MICROSCOPIC-ADD ON - Abnormal; Notable for the following:    Squamous Epithelial /  LPF FEW (*)    Bacteria, UA FEW (*)    All other components within normal limits  POCT PREGNANCY, URINE  CBC  POCT I-STAT, CHEM 8   No results found.   1. Gastroenteritis       MDM  Vague abdominal pain, with one episode of vomiting today about 1:00 reports diarrhea on Tuesday, but none on Wednesday or Thursday to legs and patient to the emergency department due to her inability to find her Medicaid card        Arman Filter,  NP 07/29/11 2313  Arman Filter, NP 07/29/11 2313

## 2011-07-29 NOTE — ED Notes (Signed)
Pt unable to void @ this time

## 2011-07-29 NOTE — ED Notes (Signed)
Pt states "I've been having stomach pain, mid back pain, nausea & I vomited x 1 today"

## 2011-07-30 NOTE — ED Provider Notes (Signed)
Medical screening examination/treatment/procedure(s) were performed by non-physician practitioner and as supervising physician I was immediately available for consultation/collaboration.  Marrietta Thunder T Laureano Hetzer, MD 07/30/11 1111 

## 2011-09-09 ENCOUNTER — Emergency Department (HOSPITAL_COMMUNITY)
Admission: EM | Admit: 2011-09-09 | Discharge: 2011-09-09 | Disposition: A | Discharge status: Home / Self Care | Payer: Self-pay | Attending: Emergency Medicine | Admitting: Emergency Medicine

## 2011-09-09 ENCOUNTER — Encounter (HOSPITAL_COMMUNITY): Payer: Self-pay | Admitting: *Deleted

## 2011-09-09 DIAGNOSIS — N63 Unspecified lump in unspecified breast: Secondary | ICD-10-CM

## 2011-09-09 DIAGNOSIS — R928 Other abnormal and inconclusive findings on diagnostic imaging of breast: Secondary | ICD-10-CM | POA: Insufficient documentation

## 2011-09-09 NOTE — ED Provider Notes (Signed)
History     CSN: [id number]  Arrival date & time 09/09/11  1053   First MD Initiated Contact with Patient 09/09/11 1130      No chief complaint on file.   (Consider location/radiation/quality/duration/timing/severity/associated sxs/prior treatment) HPI  21 year old female presents with a chief complaints of skin changes. Patient states she has been noticing a knot underneath the right breast. She notices knot when laying down one night.  Knot is persistent, remain the same size, with mild tenderness.  Patient noticed a knot more when she lies flat. Initially she thought it was resulting from her bra wire, however knot remains despite changes in bra. Denies redness or discharge.  Patient denies fever, chest pain, shortness of breath, discharge, bleeding, numbness, or rash. She denies any specific trauma. She denies taking birth control pill. She decides to come here today at the recommendation of her mom.  Denies significant cancer history. LMP 3 weeks ago.  Denies weight loss, fever, body aches, diaphoresis, or hemoptysis.  No past medical history on file.  No past surgical history on file.  No family history on file.  History  Substance Use Topics  . Smoking status: Passive Smoker    Types: Cigars  . Smokeless tobacco: Not on file  . Alcohol Use: No    OB History    Grav Para Term Preterm Abortions TAB SAB Ect Mult Living                  Review of Systems  All other systems reviewed and are negative.    Allergies  Review of patient's allergies indicates no known allergies.  Home Medications   Current Outpatient Rx  Name Route Sig Dispense Refill  . ACETAMINOPHEN 500 MG PO TABS Oral Take 1,000 mg by mouth every 6 (six) hours as needed. For pain.    . ADULT MULTIVITAMIN W/MINERALS CH Oral Take 1 tablet by mouth daily.      There were no vitals taken for this visit.  Physical Exam  Nursing note and vitals reviewed. Constitutional: She appears well-developed and  well-nourished.  HENT:  Head: Atraumatic.  Eyes: Conjunctivae are normal.  Neck: Neck supple.  Cardiovascular: Normal rate and regular rhythm.   Pulmonary/Chest: Effort normal and breath sounds normal. No respiratory distress. She has no wheezes. She has no rales. She exhibits no tenderness.    Abdominal: Soft.  Musculoskeletal: Normal range of motion.  Neurological: She is alert.  Skin: Skin is warm.  Psychiatric: She has a normal mood and affect.    ED Course  Procedures (including critical care time)  Labs Reviewed - No data to display No results found.   No diagnosis found.    MDM  Right breast examination with chaperone, reveals a 2 x 3 cm nodule to left lower edge of breast, with no significant tenderness. No evidence of [place], discharge, or rash noted.  Consider fibrocystic disease, fibroadenoma, cancer, lipoma, Phyllodes tumor, with fibroadenoma more likely.  Will recommend for further followup with Mammogram for further evaluation. The patient was understanding. She is afebrile with stable normal vital signs.        Fayrene Helper, PA-C 09/09/11 1213  Fayrene Helper, PA-C 09/09/11 1216

## 2011-09-09 NOTE — ED Notes (Signed)
Pt reports noticed knot to right breast x 1 month ago. No discharge from nipple or at site. Tender to touch.

## 2011-09-09 NOTE — Discharge Instructions (Signed)
Follow up with [facility] for further evaluation.  You may need a mammogram for further evaluation.    Fibroadenoma A fibroadenoma is a small, round, rubbery lump (tumor). The lump is not cancer. It often does not cause pain. It may move slightly when you touch it. This kind of lump can grow in one or both breasts. HOME CARE  Check your lump often for any changes.   Keep all follow-up exams and mammograms.  GET HELP RIGHT AWAY IF:  The lump changes in size.   The lump becomes tender and painful.   You have fluid coming from your nipple.  MAKE SURE YOU:  Understand these instructions.   Will watch your condition.   Will get help right away if you are not doing well or get worse.  Document Released: 09/03/2008 Document Revised: 05/27/2011 Document Reviewed: 09/03/2008 [facility] Patient Information 2012 [place], [place].   RESOURCE GUIDE  Dental Problems  Patients with Medicaid: [facility]                     [phone]                                           Phone:  [phone]                                                  If unable to pay or uninsured, contact:  [facility] or [facility]. to become qualified for the adult dental clinic.  Chronic Pain Problems Contact [facility]  [phone] Patients need to be referred by their primary care doctor.  Insufficient Money for Medicine Contact [employer]:  call "211" or [facility] 7090873586.  No Primary Care Doctor Call [facility]  9384381510 Other agencies that provide inexpensive medical care    [facility]  [phone]    [facility]  [phone]    [facility]  [phone]    [facility]  [phone]    Planned Parenthood  6085757624    [facility]  [phone]  Substance Abuse Resources [facility] and Drug Services  [phone] [facility] [phone] [facility]  [phone] Floydene Flock [phone] Residential & Outpatient Substance Abuse Program  7032505076  Psychological Services [facility]  [phone] [facility]  [phone] [facility]   [phone] (emergency services [phone])  Abuse/Neglect [facility] Child Abuse Hotline [phone] [phone] [facility] Child Abuse Hotline [phone] (After Hours)  Emergency Shelter [facility] [phone]  Maternity Homes [facility] at [facility] [place] [facility] [place] 5150944728 [facility] [phone]  MRSA Hotline #:   [phone]    [facility] Resources  [facility]  [employer]                           [facility] Dept. [address] [place]                     [address]         [address] [place] [address]  Blondell Reveal Phone:  [phone]                                  Phone:  [phone]                   Phone:  [phone]  [facility] Mental Health Phone:  [phone]  [facility] Child Abuse Hotline 9073621467 [phone] (After Hours)

## 2011-09-09 NOTE — ED Provider Notes (Signed)
Medical screening examination/treatment/procedure(s) were performed by non-physician practitioner and as supervising physician I was immediately available for consultation/collaboration.   Lyanne Co, MD 09/09/11 1226

## 2011-09-09 NOTE — Progress Notes (Signed)
ED CM noted pt without pcp and coverage. 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  Encouraged her to speak with her parents to see if she is still covered by them and if so she can get a copy of coverage information to assist with finding a local pcp for follow up services.  Provided with list of [place] self pay pcp, [facility], needymeds.com and other [place] resources.

## 2012-01-26 ENCOUNTER — Encounter (HOSPITAL_COMMUNITY): Payer: Self-pay | Admitting: Emergency Medicine

## 2012-01-26 ENCOUNTER — Emergency Department (HOSPITAL_COMMUNITY)
Admission: EM | Admit: 2012-01-26 | Discharge: 2012-01-26 | Disposition: A | Discharge status: Home / Self Care | Payer: Self-pay | Attending: Emergency Medicine | Admitting: Emergency Medicine

## 2012-01-26 DIAGNOSIS — J029 Acute pharyngitis, unspecified: Secondary | ICD-10-CM | POA: Insufficient documentation

## 2012-01-26 DIAGNOSIS — F17200 Nicotine dependence, unspecified, uncomplicated: Secondary | ICD-10-CM | POA: Insufficient documentation

## 2012-01-26 LAB — RAPID STREP SCREEN (MED CTR MEBANE ONLY): Streptococcus, Group A Screen (Direct): NEGATIVE

## 2012-01-26 MED ORDER — IBUPROFEN 800 MG PO TABS
800.0000 mg | ORAL_TABLET | Freq: Once | ORAL | Status: AC
  Administered 2012-01-26: 800 mg via ORAL
  Filled 2012-01-26: qty 1

## 2012-01-26 MED ORDER — IBUPROFEN 800 MG PO TABS: 800.0000 mg | ORAL_TABLET | Freq: Three times a day (TID) | ORAL | Status: AC | PRN

## 2012-01-26 NOTE — ED Provider Notes (Signed)
History     CSN: [id number]  Arrival date & time 01/26/12  2036   First MD Initiated Contact with Patient 01/26/12 2157      Chief Complaint  Patient presents with  . Sore Throat  . Headache  . Nausea  . Emesis    (Consider location/radiation/quality/duration/timing/severity/associated sxs/prior treatment) HPI Comments: Sore throat, vomiting x 1, starting yesterday. No treatments prior. No cough. Neck is sore. No sick contacts.  Onset acute. Course is constant. Swallowing makes the pain worse. Nothing is making it better.  Patient is a 21 y.o. female presenting with pharyngitis, headaches, and vomiting. The history is provided by the patient.  Sore Throat This is a new problem. The current episode started yesterday. The problem occurs constantly. The problem has been unchanged. Associated symptoms include headaches, nausea, a sore throat, swollen glands and vomiting. Pertinent negatives include no abdominal pain, chest pain, congestion, coughing, fever, myalgias or rash. The symptoms are aggravated by swallowing.  Headache  Associated symptoms include nausea and vomiting. Pertinent negatives include no fever.  Emesis  Associated symptoms include headaches. Pertinent negatives include no abdominal pain, no cough, no diarrhea, no fever and no myalgias.    History reviewed. No pertinent past medical history.  History reviewed. No pertinent past surgical history.  No family history on file.  History  Substance Use Topics  . Smoking status: Passive Smoker    Types: Cigars  . Smokeless tobacco: Not on file  . Alcohol Use: No    OB History    Grav Para Term Preterm Abortions TAB SAB Ect Mult Living                  Review of Systems  Constitutional: Negative for fever.  HENT: Positive for sore throat. Negative for ear pain, congestion, rhinorrhea, trouble swallowing and neck stiffness.   Eyes: Negative for redness.  Respiratory: Negative for cough.   Cardiovascular:  Negative for chest pain.  Gastrointestinal: Positive for nausea and vomiting. Negative for abdominal pain and diarrhea.  Genitourinary: Negative for dysuria.  Musculoskeletal: Negative for myalgias.  Skin: Negative for rash.  Neurological: Positive for headaches.    Allergies  Review of patient's allergies indicates no known allergies.  Home Medications   Current Outpatient Rx  Name Route Sig Dispense Refill  . ACETAMINOPHEN 500 MG PO TABS Oral Take 1,000 mg by mouth every 6 (six) hours as needed. For pain.    . ADULT MULTIVITAMIN W/MINERALS CH Oral Take 1 tablet by mouth daily.      BP 123/74  Pulse 110  Temp 99.9 F (37.7 C) (Oral)  SpO2 100%  Physical Exam  Nursing note and vitals reviewed. Constitutional: She appears well-developed and well-nourished.  HENT:  Head: Normocephalic and atraumatic.  Right Ear: Hearing, tympanic membrane and ear canal normal.  Left Ear: Hearing, tympanic membrane and ear canal normal.  Nose: Nose normal.  Mouth/Throat: Uvula is midline. Mucous membranes are not dry. Posterior oropharyngeal erythema present. No oropharyngeal exudate, posterior oropharyngeal edema or tonsillar abscesses.  Eyes: Conjunctivae are normal. Right eye exhibits no discharge. Left eye exhibits no discharge.  Neck: Normal range of motion. Neck supple.  Cardiovascular: Normal rate, regular rhythm and normal heart sounds.   Pulmonary/Chest: Effort normal and breath sounds normal.  Abdominal: Soft. There is no tenderness.  Neurological: She is alert.  Skin: Skin is warm and dry.  Psychiatric: She has a normal mood and affect.    ED Course  Procedures (including critical care time)  Labs Reviewed  RAPID STREP SCREEN   No results found.   1. Pharyngitis     10:08 PM Patient seen and examined. Work-up initiated. Medications ordered.   Vital signs reviewed and are as follows: Filed Vitals:   01/26/12 2100  BP: 123/74  Pulse: 110  Temp: 99.9 F (37.7 C)     Strep test is negative. Patient informed. Patient counseled on conservative management of her symptoms. Urged patient to return with worsening trouble swallowing, high persistent fever, persistent vomiting, or she has any other concerns.   MDM  Patient with pharyngitis, strep test is negative. Will treat conservatively. Patient appears nontoxic and is stable time of discharge.        Renne Crigler, [place] 01/27/12 [phone]

## 2012-01-27 NOTE — ED Provider Notes (Signed)
Medical screening examination/treatment/procedure(s) were performed by non-physician practitioner and as supervising physician I was immediately available for consultation/collaboration.   Colon Rueth, MD 01/27/12 1602 

## 2012-07-27 ENCOUNTER — Emergency Department (HOSPITAL_COMMUNITY)
Admission: EM | Admit: 2012-07-27 | Discharge: 2012-07-27 | Disposition: A | Discharge status: Home / Self Care | Payer: Self-pay

## 2012-08-23 ENCOUNTER — Encounter (HOSPITAL_COMMUNITY): Payer: Self-pay | Admitting: *Deleted

## 2012-08-23 ENCOUNTER — Inpatient Hospital Stay (HOSPITAL_COMMUNITY)
Admission: AD | Admit: 2012-08-23 | Discharge: 2012-08-23 | Disposition: A | Discharge status: Home / Self Care | Payer: Self-pay | Source: Ambulatory Visit | Attending: Obstetrics & Gynecology | Admitting: Obstetrics & Gynecology

## 2012-08-23 DIAGNOSIS — N943 Premenstrual tension syndrome: Secondary | ICD-10-CM | POA: Insufficient documentation

## 2012-08-23 DIAGNOSIS — Z3202 Encounter for pregnancy test, result negative: Secondary | ICD-10-CM | POA: Insufficient documentation

## 2012-08-23 LAB — URINE MICROSCOPIC-ADD ON

## 2012-08-23 LAB — URINALYSIS, ROUTINE W REFLEX MICROSCOPIC
Bilirubin Urine: NEGATIVE
Ketones, ur: NEGATIVE mg/dL
Nitrite: NEGATIVE
pH: 5.5 (ref 5.0–8.0)

## 2012-08-23 LAB — WET PREP, GENITAL: Yeast Wet Prep HPF POC: NONE SEEN

## 2012-08-23 NOTE — MAU Provider Note (Signed)
History     CSN: [id number]  Arrival date and time: 08/23/12 1413   First French Kendra Initiated Contact with Patient 08/23/12 1507      Chief Complaint  Patient presents with  . Pregnancy sxs    HPI Ms. STEPAHNIE CAMPO is a 22 y.o. G1P0101 who presents to MAU today with complaint of breast tenderness, occasional N/V and lower abdominal cramping. LMP was 07/29/12. She is currently sexually active and does not use any type of birth control. She states no new partners. She denies vaginal bleeding, abnormal discharge or fever. She has had negative HPT x 2 and went to Urgent care who told her that they would do the same UPT as at home and not a blood test, so she was advised to come here for further evaluation. She reports a history of regular periods without significant PMS symptoms.   OB History   Grav Para Term Preterm Abortions TAB SAB Ect Mult Living   1 1  1      1       History reviewed. No pertinent past medical history.  History reviewed. No pertinent past surgical history.  History reviewed. No pertinent family history.  History  Substance Use Topics  . Smoking status: Current Some Day Smoker    Types: Cigars  . Smokeless tobacco: Never Used  . Alcohol Use: No    Allergies: No Known Allergies  Prescriptions prior to admission  Medication Sig Dispense Refill  . acetaminophen (TYLENOL) 500 MG tablet Take 1,000 mg by mouth every 6 (six) hours as needed. For pain.      . Multiple Vitamin (MULITIVITAMIN WITH MINERALS) TABS Take 1 tablet by mouth daily.        Review of Systems  Constitutional: Negative for fever, chills and malaise/fatigue.  Gastrointestinal: Positive for nausea, vomiting and abdominal pain. Negative for diarrhea and constipation.  Genitourinary: Negative for dysuria, urgency and frequency.       Neg - vaginal bleeding Neg - abnormal discharge + breast tenderness   Physical Exam   Blood pressure 108/61, pulse 60, temperature 98.9 F (37.2 C),  temperature source Oral, resp. rate 18, height 5\' 3"  (1.6 m), weight 200 lb 12.8 oz (91.082 kg), last menstrual period 07/29/2012.  Physical Exam  Constitutional: She is oriented to person, place, and time. She appears well-developed and well-nourished. No distress.  HENT:  Head: Normocephalic and atraumatic.  Cardiovascular: Normal rate, regular rhythm and normal heart sounds.   Respiratory: Effort normal and breath sounds normal. No respiratory distress.  GI: Soft. Bowel sounds are normal. She exhibits no distension and no mass. There is tenderness (very mild diffuse tenderness most prominent in the epigastric region). There is no rebound and no guarding.  Genitourinary: Vagina normal. Uterus is not enlarged and not tender. Cervix exhibits no motion tenderness, no discharge and no friability. Right adnexum displays no mass and no tenderness. Left adnexum displays no mass and no tenderness.  Neurological: She is alert and oriented to person, place, and time.  Skin: Skin is warm and dry. No erythema.  Psychiatric: She has a normal mood and affect.    Results for orders placed during the hospital encounter of 08/23/12 (from the past 24 hour(s))  URINALYSIS, ROUTINE W REFLEX MICROSCOPIC     Status: Abnormal   Collection Time    08/23/12  2:51 PM      Result Value Range   Color, Urine YELLOW  YELLOW   APPearance CLEAR  CLEAR   Specific  Gravity, Urine 1.025  1.005 - 1.030   pH 5.5  5.0 - 8.0   Glucose, UA NEGATIVE  NEGATIVE mg/dL   Hgb urine dipstick TRACE (*) NEGATIVE   Bilirubin Urine NEGATIVE  NEGATIVE   Ketones, ur NEGATIVE  NEGATIVE mg/dL   Protein, ur NEGATIVE  NEGATIVE mg/dL   Urobilinogen, UA 0.2  0.0 - 1.0 mg/dL   Nitrite NEGATIVE  NEGATIVE   Leukocytes, UA NEGATIVE  NEGATIVE  URINE MICROSCOPIC-ADD ON     Status: Abnormal   Collection Time    08/23/12  2:51 PM      Result Value Range   Squamous Epithelial / LPF FEW (*) RARE   WBC, UA 0-2  <3 WBC/hpf   RBC / HPF 3-6  <3  RBC/hpf  POCT PREGNANCY, URINE     Status: None   Collection Time    08/23/12  3:11 PM      Result Value Range   Preg Test, Ur NEGATIVE  NEGATIVE  HCG, SERUM, QUALITATIVE     Status: None   Collection Time    08/23/12  3:15 PM      Result Value Range   Preg, Serum NEGATIVE  NEGATIVE  WET PREP, GENITAL     Status: Abnormal   Collection Time    08/23/12  3:19 PM      Result Value Range   Yeast Wet Prep HPF POC NONE SEEN  NONE SEEN   Trich, Wet Prep NONE SEEN  NONE SEEN   Clue Cells Wet Prep HPF POC NONE SEEN  NONE SEEN   WBC, Wet Prep HPF POC MANY (*) NONE SEEN    MAU Course  Procedures None  MDM Negative serum pregnancy test today. Patient has not yet missed her period. Advised to wait about 1 week and retake HPT if still no period. Most likely patient is dealing with PMS now as her period should start in the next few days.   Assessment and Plan  A: Premenstrual Syndrome  P: Discharge home Recommended Ibuprofen or Tylenol PRN for cramps GC/C pending, will contact with positive results only Recommended patient take HPT in 1 week if no period, if HPT is still negative she should call [facility] clinic to schedule follow-up Patient may return to MAU as needed or if her condition were to change or worsen   Freddi Starr, PA-C  08/23/2012, 3:07 PM

## 2012-08-23 NOTE — MAU Note (Signed)
States she has been having pregnancy sxs, breast tenderness, decreased appetite. Had -HPT X 2, -UPT at [facility]. Still has sxs so was advised to come to MAU for further evaluation.

## 2012-08-24 LAB — GC/CHLAMYDIA PROBE AMP: CT Probe RNA: NEGATIVE

## 2012-08-24 NOTE — MAU Provider Note (Signed)

## 2012-08-28 ENCOUNTER — Encounter (HOSPITAL_COMMUNITY): Payer: Self-pay | Admitting: General Practice

## 2012-08-28 ENCOUNTER — Inpatient Hospital Stay (HOSPITAL_COMMUNITY)
Admission: AD | Admit: 2012-08-28 | Discharge: 2012-08-28 | Disposition: A | Discharge status: Home / Self Care | Payer: Self-pay | Source: Ambulatory Visit | Attending: Obstetrics & Gynecology | Admitting: Obstetrics & Gynecology

## 2012-08-28 DIAGNOSIS — K30 Functional dyspepsia: Secondary | ICD-10-CM

## 2012-08-28 DIAGNOSIS — Z3202 Encounter for pregnancy test, result negative: Secondary | ICD-10-CM | POA: Insufficient documentation

## 2012-08-28 DIAGNOSIS — N946 Dysmenorrhea, unspecified: Secondary | ICD-10-CM | POA: Insufficient documentation

## 2012-08-28 DIAGNOSIS — R110 Nausea: Secondary | ICD-10-CM | POA: Insufficient documentation

## 2012-08-28 DIAGNOSIS — R109 Unspecified abdominal pain: Secondary | ICD-10-CM | POA: Insufficient documentation

## 2012-08-28 DIAGNOSIS — K3189 Other diseases of stomach and duodenum: Secondary | ICD-10-CM

## 2012-08-28 LAB — URINE MICROSCOPIC-ADD ON

## 2012-08-28 LAB — URINALYSIS, ROUTINE W REFLEX MICROSCOPIC
Bilirubin Urine: NEGATIVE
Glucose, UA: NEGATIVE mg/dL
Specific Gravity, Urine: 1.02 (ref 1.005–1.030)
Urobilinogen, UA: 0.2 mg/dL (ref 0.0–1.0)
pH: 6.5 (ref 5.0–8.0)

## 2012-08-28 MED ORDER — FAMOTIDINE 20 MG PO TABS: 20.0000 mg | ORAL_TABLET | Freq: Once | ORAL | Status: DC

## 2012-08-28 MED ORDER — KETOROLAC TROMETHAMINE 60 MG/2ML IM SOLN
INTRAMUSCULAR | Status: AC
  Filled 2012-08-28: qty 2

## 2012-08-28 MED ORDER — FAMOTIDINE IN NACL 20-0.9 MG/50ML-% IV SOLN: 20.0000 mg | Freq: Once | INTRAVENOUS | Status: DC

## 2012-08-28 MED ORDER — FAMOTIDINE 20 MG PO TABS
ORAL_TABLET | ORAL | Status: AC
  Administered 2012-08-28: 20 mg
  Filled 2012-08-28: qty 1

## 2012-08-28 NOTE — MAU Provider Note (Signed)
History     CSN: [id number]  Arrival date and time: 08/28/12 1537   None     Chief Complaint  Patient presents with  . Nausea  . Abdominal Pain   HPI  Courtney Salinas is 22 y.o. G1P0101 presents with pregnancy sxs of nausea, dry heaves, upper abdominal pain and little breast tenderness.  Vomited X 3 this week.  Denies known exposure to illness.  Does have "burping".  States Courtney Salinas is hungry and eats more than usual.  No changes in her stool,denies rectal bleeding. Courtney Salinas denies constipation/diarrhea.  Courtney Salinas was seen here on 08/23/12 with same sxs with both negative urine and serum pregnancy tests.  LMP was 08/25/12, on cycle now.  Began as brown discharge now red/blood.  1 partner.  Not using contraception.  Symptoms are unchanged from last visit.  Has tried tylenol without relief.          No past medical history on file.  No past surgical history on file.  No family history on file.  History  Substance Use Topics  . Smoking status: Current Some Day Smoker    Types: Cigars  . Smokeless tobacco: Never Used  . Alcohol Use: No    Allergies: No Known Allergies  Prescriptions prior to admission  Medication Sig Dispense Refill  . acetaminophen (TYLENOL) 500 MG tablet Take 1,000 mg by mouth every 6 (six) hours as needed. For pain....migrain      . Multiple Vitamin (MULITIVITAMIN WITH MINERALS) TABS Take 1 tablet by mouth daily.        Review of Systems  Constitutional: Negative.   HENT: Negative.   Respiratory: Negative.   Cardiovascular: Negative.   Gastrointestinal: Positive for nausea, vomiting (heaves) and abdominal pain.  Genitourinary:       On menstrual cycle   Physical Exam   Blood pressure 104/62, pulse 69, temperature 99.1 F (37.3 C), temperature source Oral, resp. rate 16, height 5\' 4"  (1.626 m), weight 203 lb 12.8 oz (92.443 kg), last menstrual period 08/25/2012, SpO2 100.00%.  Physical Exam  Constitutional: Courtney Salinas is oriented to person, place, and time. Courtney Salinas appears  well-developed and well-nourished. No distress.  HENT:  Head: Normocephalic.  Neck: Normal range of motion.  Cardiovascular: Normal rate.   Respiratory: Effort normal.  GI: Courtney Salinas exhibits no distension and no mass. Tenderness: mid abdominal-mild. There is no rebound and no guarding.  Genitourinary:  Not indicated  Neurological: Courtney Salinas is alert and oriented to person, place, and time.  Skin: Skin is warm and dry.  Psychiatric: Courtney Salinas has a normal mood and affect. Her behavior is normal.   Results for orders placed during the hospital encounter of 08/28/12 (from the past 24 hour(s))  URINALYSIS, ROUTINE W REFLEX MICROSCOPIC     Status: Abnormal   Collection Time    08/28/12  4:00 PM      Result Value Range   Color, Urine YELLOW  YELLOW   APPearance CLEAR  CLEAR   Specific Gravity, Urine 1.020  1.005 - 1.030   pH 6.5  5.0 - 8.0   Glucose, UA NEGATIVE  NEGATIVE mg/dL   Hgb urine dipstick LARGE (*) NEGATIVE   Bilirubin Urine NEGATIVE  NEGATIVE   Ketones, ur NEGATIVE  NEGATIVE mg/dL   Protein, ur NEGATIVE  NEGATIVE mg/dL   Urobilinogen, UA 0.2  0.0 - 1.0 mg/dL   Nitrite NEGATIVE  NEGATIVE   Leukocytes, UA NEGATIVE  NEGATIVE  URINE MICROSCOPIC-ADD ON     Status: Abnormal   Collection  Time    08/28/12  4:00 PM      Result Value Range   Squamous Epithelial / LPF FEW (*) RARE   WBC, UA 0-2  <3 WBC/hpf   RBC / HPF 7-10  <3 RBC/hpf  POCT PREGNANCY, URINE     Status: None   Collection Time    08/28/12  4:08 PM      Result Value Range   Preg Test, Ur NEGATIVE  NEGATIVE    MAU Course  Procedures  MDM Pepcid 20mg  po now. Discussed with the patient, that GYN cause has been ruled out and that the pain may be related to her cycle but that upper abdominal pain may be GI related.  Discussed with her avoidance of fried, spicy, greasy food and the PRN use of over the counter antacids.  Also told her if sxs continue would need GI doctor.  Courtney Salinas agreed.   Assessment and Plan  A:  Pregnancy-like  symptoms with negative pregnancy test       Dysmenorrhea      Upper abdominal pain-possibly indigestion  P:  Pepcid 20mg  po given here     Discussed diet, OTC meds for acid reflux and the need to have further evaluation if sxs persist after 2 weeks     Courtney Salinas voiced understanding and agreement with plan of care.    Also discussed menstrual pain    Advil WITH food for cramping related to her cycle.  KEY,EVE M 08/28/2012, 4:23 PM

## 2012-08-28 NOTE — MAU Note (Signed)
Patient states she has been having symptoms of pregnancy. Dry heaves and nausea with lower and upper abdominal pain. Had a negative serum pregnancy on 3-5.

## 2012-08-28 NOTE — MAU Note (Signed)
Pt here for pain, was seen 5 days ago, upt negative. Here today with upper abd pain and sore breasts.

## 2013-01-22 ENCOUNTER — Emergency Department (HOSPITAL_COMMUNITY)
Admission: EM | Admit: 2013-01-22 | Discharge: 2013-01-22 | Disposition: A | Discharge status: Home / Self Care | Payer: No Typology Code available for payment source | Attending: Emergency Medicine | Admitting: Emergency Medicine

## 2013-01-22 ENCOUNTER — Encounter (HOSPITAL_COMMUNITY): Payer: Self-pay | Admitting: Emergency Medicine

## 2013-01-22 DIAGNOSIS — S46909A Unspecified injury of unspecified muscle, fascia and tendon at shoulder and upper arm level, unspecified arm, initial encounter: Secondary | ICD-10-CM | POA: Insufficient documentation

## 2013-01-22 DIAGNOSIS — S0993XA Unspecified injury of face, initial encounter: Secondary | ICD-10-CM | POA: Insufficient documentation

## 2013-01-22 DIAGNOSIS — Y92410 Unspecified street and highway as the place of occurrence of the external cause: Secondary | ICD-10-CM | POA: Insufficient documentation

## 2013-01-22 DIAGNOSIS — Y9389 Activity, other specified: Secondary | ICD-10-CM | POA: Insufficient documentation

## 2013-01-22 DIAGNOSIS — Z87891 Personal history of nicotine dependence: Secondary | ICD-10-CM | POA: Insufficient documentation

## 2013-01-22 DIAGNOSIS — S0990XA Unspecified injury of head, initial encounter: Secondary | ICD-10-CM | POA: Insufficient documentation

## 2013-01-22 DIAGNOSIS — Z79899 Other long term (current) drug therapy: Secondary | ICD-10-CM | POA: Insufficient documentation

## 2013-01-22 DIAGNOSIS — S4980XA Other specified injuries of shoulder and upper arm, unspecified arm, initial encounter: Secondary | ICD-10-CM | POA: Insufficient documentation

## 2013-01-22 MED ORDER — METHOCARBAMOL 500 MG PO TABS: 500.0000 mg | ORAL_TABLET | Freq: Two times a day (BID) | ORAL | Status: DC

## 2013-01-22 MED ORDER — IBUPROFEN 800 MG PO TABS: 800.0000 mg | ORAL_TABLET | Freq: Three times a day (TID) | ORAL | Status: DC
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
## 2013-01-23 NOTE — ED Provider Notes (Signed)
Medical screening examination/treatment/procedure(s) were performed by non-physician practitioner and as supervising physician I was immediately available for consultation/collaboration.   Bryar Rennie Joseph Lanna Labella, MD 01/23/13 1642 

## 2013-01-25 ENCOUNTER — Emergency Department (HOSPITAL_COMMUNITY)
Admission: EM | Admit: 2013-01-25 | Discharge: 2013-01-25 | Disposition: A | Discharge status: Home / Self Care | Payer: No Typology Code available for payment source | Attending: Emergency Medicine | Admitting: Emergency Medicine

## 2013-01-25 ENCOUNTER — Encounter (HOSPITAL_COMMUNITY): Payer: Self-pay | Admitting: *Deleted

## 2013-01-25 DIAGNOSIS — Z87891 Personal history of nicotine dependence: Secondary | ICD-10-CM | POA: Insufficient documentation

## 2013-01-25 DIAGNOSIS — Z3202 Encounter for pregnancy test, result negative: Secondary | ICD-10-CM | POA: Insufficient documentation

## 2013-01-25 DIAGNOSIS — Z79899 Other long term (current) drug therapy: Secondary | ICD-10-CM | POA: Insufficient documentation

## 2013-01-25 DIAGNOSIS — R112 Nausea with vomiting, unspecified: Secondary | ICD-10-CM

## 2013-01-25 DIAGNOSIS — R1013 Epigastric pain: Secondary | ICD-10-CM

## 2013-01-25 LAB — URINALYSIS, ROUTINE W REFLEX MICROSCOPIC
Bilirubin Urine: NEGATIVE
Nitrite: NEGATIVE
Specific Gravity, Urine: 1.032 — ABNORMAL HIGH (ref 1.005–1.030)
pH: 5.5 (ref 5.0–8.0)

## 2013-01-25 LAB — URINE MICROSCOPIC-ADD ON

## 2013-01-25 LAB — POCT PREGNANCY, URINE: Preg Test, Ur: NEGATIVE

## 2013-01-25 MED ORDER — FAMOTIDINE 20 MG PO TABS: 20.0000 mg | ORAL_TABLET | Freq: Two times a day (BID) | ORAL | Status: DC

## 2013-01-25 MED ORDER — PROMETHAZINE HCL 25 MG PO TABS
25.0000 mg | ORAL_TABLET | Freq: Once | ORAL | Status: AC
  Administered 2013-01-25: 25 mg via ORAL
  Filled 2013-01-25: qty 1

## 2013-01-25 MED ORDER — PROMETHAZINE HCL 25 MG PO TABS: 25.0000 mg | ORAL_TABLET | Freq: Four times a day (QID) | ORAL | Status: DC | PRN

## 2013-01-25 MED ORDER — FAMOTIDINE 20 MG PO TABS
20.0000 mg | ORAL_TABLET | Freq: Once | ORAL | Status: AC
  Administered 2013-01-25: 20 mg via ORAL
  Filled 2013-01-25: qty 1

## 2013-01-25 NOTE — Progress Notes (Signed)
Patient confirmed that she does not have a pcp or insurance.  [facility] provided patient with a list of pcps who accept self pay patients, list of discounted pharmacies and website needymeds.com and discount RX card given for medication assistance, information regarding the Affordable Care Act and Medicaid for insuarance and a list of community financial assistance such as local churches and [employer].  [facility] strongly encouraged patient to find a pccp.  Patient verbalized understanding and was thankful for resources.
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
## 2013-01-27 NOTE — ED Provider Notes (Signed)
Medical screening examination/treatment/procedure(s) were performed by non-physician practitioner and as supervising physician I was immediately available for consultation/collaboration. Devoria Albe, MD, FACEP   Ward Givens, MD 01/27/13 [phone]

## 2013-02-26 ENCOUNTER — Emergency Department (HOSPITAL_COMMUNITY): Payer: No Typology Code available for payment source

## 2013-02-26 ENCOUNTER — Encounter (HOSPITAL_COMMUNITY): Payer: Self-pay | Admitting: Emergency Medicine

## 2013-02-26 ENCOUNTER — Emergency Department (HOSPITAL_COMMUNITY)
Admission: EM | Admit: 2013-02-26 | Discharge: 2013-02-26 | Disposition: A | Discharge status: Home / Self Care | Payer: No Typology Code available for payment source | Attending: Emergency Medicine | Admitting: Emergency Medicine

## 2013-02-26 DIAGNOSIS — J069 Acute upper respiratory infection, unspecified: Secondary | ICD-10-CM | POA: Insufficient documentation

## 2013-02-26 DIAGNOSIS — Z87891 Personal history of nicotine dependence: Secondary | ICD-10-CM | POA: Insufficient documentation

## 2013-02-26 DIAGNOSIS — R0602 Shortness of breath: Secondary | ICD-10-CM | POA: Insufficient documentation

## 2013-02-26 DIAGNOSIS — Z79899 Other long term (current) drug therapy: Secondary | ICD-10-CM | POA: Insufficient documentation

## 2013-02-26 MED ORDER — ACETAMINOPHEN 500 MG PO TABS
1000.0000 mg | ORAL_TABLET | Freq: Once | ORAL | Status: AC
  Administered 2013-02-26: 1000 mg via ORAL
  Filled 2013-02-26: qty 2

## 2013-02-26 NOTE — Progress Notes (Signed)
[facility] provided pt with a list of primary care resources in [place].  °

## 2013-02-26 NOTE — ED Notes (Signed)
Pt c/o nasal congestion, headaches, gen body aches, and vomiting x 3 days.  Denies abd pain.

## 2013-02-26 NOTE — ED Provider Notes (Signed)
CSN: [id number]     Arrival date & time 02/26/13  1208 History   First MD Initiated Contact with Patient 02/26/13 1216     Chief Complaint  Patient presents with  . Nasal Congestion  . Headache  . Generalized Body Aches  . Emesis   (Consider location/radiation/quality/duration/timing/severity/associated sxs/prior Treatment) HPI Comments: 22 year old female with 3 days of frontal headache, facial pain, nasal congestion, rhinorrhea, sore throat and productive cough. She also has had intermittent nausea and vomiting. Usually she vomits after having a prolonged coughing spell. She has had fevers and chills as well. Her max temperature was 100.7. She denies any abdominal or chest pain. She denies any neck pain or stiffness. No weakness or numbness. She's tried intermittent Tylenol with only mild relief. Denies any urinary symptoms or back pain.  Patient is a 22 y.o. female presenting with headaches and vomiting. The history is provided by the patient.  Headache Associated symptoms: congestion, cough, fever (100.7), nausea, sore throat and vomiting   Associated symptoms: no abdominal pain, no ear pain and no neck pain   Emesis Associated symptoms: chills, headaches and sore throat   Associated symptoms: no abdominal pain     History reviewed. No pertinent past medical history. No past surgical history on file. No family history on file. History  Substance Use Topics  . Smoking status: Former Smoker    Types: Cigars    Quit date: 08/13/2012  . Smokeless tobacco: Never Used  . Alcohol Use: No   OB History   Grav Para Term Preterm Abortions TAB SAB Ect Mult Living   1 1  1      1      Review of Systems  Constitutional: Positive for fever (100.7) and chills.  HENT: Positive for congestion, sore throat and rhinorrhea. Negative for ear pain and neck pain.   Respiratory: Positive for cough and shortness of breath (worst at night through her nose). Negative for wheezing.   Cardiovascular:  Negative for chest pain.  Gastrointestinal: Positive for nausea and vomiting. Negative for abdominal pain.  Genitourinary: Negative for dysuria.  Neurological: Positive for headaches.  All other systems reviewed and are negative.    Allergies  Review of patient's allergies indicates no known allergies.  Home Medications   Current Outpatient Rx  Name  Route  Sig  Dispense  Refill  . acetaminophen (TYLENOL) 500 MG tablet   Oral   Take 1,500 mg by mouth every 6 (six) hours as needed for pain.          . famotidine (PEPCID) 20 MG tablet   Oral   Take 1 tablet (20 mg total) by mouth 2 (two) times daily.   30 tablet   0   . Multiple Vitamin (MULITIVITAMIN WITH MINERALS) TABS   Oral   Take 1 tablet by mouth daily.         Marland Kitchen EXPIRED: promethazine (PHENERGAN) 25 MG tablet   Oral   Take 1 tablet (25 mg total) by mouth every 6 (six) hours as needed for nausea.   30 tablet   0   . promethazine (PHENERGAN) 25 MG tablet   Oral   Take 1 tablet (25 mg total) by mouth every 6 (six) hours as needed for nausea.   20 tablet   0    BP 115/62  Pulse 94  Temp(Src) 98.4 F (36.9 C) (Oral)  Resp 16  SpO2 98%  LMP 02/03/2013 Physical Exam  Vitals reviewed. Constitutional: She is oriented to person,  place, and time. She appears well-developed and well-nourished.  HENT:  Head: Normocephalic and atraumatic.  Right Ear: External ear normal.  Left Ear: External ear normal.  Nose: Nose normal. Right sinus exhibits no maxillary sinus tenderness and no frontal sinus tenderness. Left sinus exhibits no maxillary sinus tenderness and no frontal sinus tenderness.  Mouth/Throat: Posterior oropharyngeal erythema (mild) present. No oropharyngeal exudate or posterior oropharyngeal edema.  Eyes: EOM are normal. Pupils are equal, round, and reactive to light. Right eye exhibits no discharge. Left eye exhibits no discharge.  Neck: Normal range of motion. Neck supple.  Cardiovascular: Normal rate,  regular rhythm and normal heart sounds.   Pulmonary/Chest: Effort normal and breath sounds normal. She has no wheezes.  Abdominal: Soft. There is no tenderness.  Neurological: She is alert and oriented to person, place, and time. No sensory deficit. She exhibits normal muscle tone. GCS eye subscore is 4. GCS verbal subscore is 5. GCS motor subscore is 6.  Skin: Skin is warm and dry.    ED Course  Procedures (including critical care time) Labs Review Labs Reviewed - No data to display Imaging Review Dg Chest 2 View  02/26/2013   *RADIOLOGY REPORT*  Clinical Data: Cough and congestion  CHEST - 2 VIEW  Comparison: None.  Findings: The heart and pulmonary vascularity are within normal limits.  The lungs are clear bilaterally.  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  IMPRESSION: No acute abnormality noted.   Original Report Authenticated By: Alcide Clever, M.D.    MDM   1. Upper respiratory infection    Her symptoms are consistent with a viral URI. Will treat symptomatically. She is well appearing and has no signs of dehydration. No respiratory distress. Has benign abdominal exam. Discussed return precautions, and patient will follow up with her PCP.    Audree Camel, MD 02/26/13 [phone]

## 2013-08-20 ENCOUNTER — Encounter (HOSPITAL_COMMUNITY): Payer: Self-pay | Admitting: Emergency Medicine

## 2013-08-20 ENCOUNTER — Emergency Department (HOSPITAL_COMMUNITY)
Admission: EM | Admit: 2013-08-20 | Discharge: 2013-08-20 | Disposition: A | Discharge status: Home / Self Care | Payer: No Typology Code available for payment source | Attending: Emergency Medicine | Admitting: Emergency Medicine

## 2013-08-20 DIAGNOSIS — M545 Low back pain, unspecified: Secondary | ICD-10-CM | POA: Insufficient documentation

## 2013-08-20 DIAGNOSIS — R000 Tachycardia, unspecified: Secondary | ICD-10-CM | POA: Insufficient documentation

## 2013-08-20 DIAGNOSIS — Z87891 Personal history of nicotine dependence: Secondary | ICD-10-CM | POA: Insufficient documentation

## 2013-08-20 DIAGNOSIS — J029 Acute pharyngitis, unspecified: Secondary | ICD-10-CM | POA: Insufficient documentation

## 2013-08-20 LAB — RAPID STREP SCREEN (MED CTR MEBANE ONLY): Streptococcus, Group A Screen (Direct): NEGATIVE

## 2013-08-20 LAB — MONONUCLEOSIS SCREEN: Mono Screen: NEGATIVE

## 2013-08-20 MED ORDER — SODIUM CHLORIDE 0.9 % IV BOLUS (SEPSIS)
1000.0000 mL | Freq: Once | INTRAVENOUS | Status: AC
  Administered 2013-08-20: 1000 mL via INTRAVENOUS

## 2013-08-20 MED ORDER — MAGIC MOUTHWASH: 5.0000 mL | Freq: Three times a day (TID) | ORAL | Status: DC | PRN

## 2013-08-20 MED ORDER — KETOROLAC TROMETHAMINE 15 MG/ML IJ SOLN
30.0000 mg | Freq: Once | INTRAMUSCULAR | Status: AC
  Administered 2013-08-20: 30 mg via INTRAVENOUS
  Filled 2013-08-20: qty 2

## 2013-08-20 MED ORDER — IBUPROFEN 800 MG PO TABS
800.0000 mg | ORAL_TABLET | Freq: Once | ORAL | Status: AC
  Administered 2013-08-20: 800 mg via ORAL
  Filled 2013-08-20: qty 1

## 2013-08-20 MED ORDER — PENICILLIN G BENZATHINE 1200000 UNIT/2ML IM SUSP
1.2000 10*6.[IU] | Freq: Once | INTRAMUSCULAR | Status: AC
  Administered 2013-08-20: 1.2 10*6.[IU] via INTRAMUSCULAR
  Filled 2013-08-20: qty 2

## 2013-08-20 MED ORDER — MORPHINE SULFATE 4 MG/ML IJ SOLN: 4.0000 mg | Freq: Once | INTRAMUSCULAR | Status: DC

## 2013-08-20 MED ORDER — OXYCODONE-ACETAMINOPHEN 5-325 MG PO TABS: 2.0000 | ORAL_TABLET | ORAL | Status: DC | PRN

## 2013-08-20 MED ORDER — ACETAMINOPHEN 325 MG PO TABS
650.0000 mg | ORAL_TABLET | Freq: Once | ORAL | Status: AC
  Administered 2013-08-20: 650 mg via ORAL
  Filled 2013-08-20: qty 2

## 2013-08-20 MED ORDER — SODIUM CHLORIDE 0.9 % IV BOLUS (SEPSIS)
1000.0000 mL | Freq: Once | INTRAVENOUS | Status: AC
Start: 2013-08-20 — End: 2013-08-20
  Administered 2013-08-20: 1000 mL via INTRAVENOUS

## 2013-08-20 MED ORDER — MAGIC MOUTHWASH
5.0000 mL | Freq: Once | ORAL | Status: AC
  Administered 2013-08-20: 5 mL via ORAL
  Filled 2013-08-20: qty 5

## 2013-08-20 NOTE — Discharge Instructions (Signed)
Please gargle with saltwater several times daily. Take pain medication and mouthwash as needed. Return if your symptoms worsen or havingany other concern, especially trouble swallowing or trouble breathing. Follow up with ENT Dr. for further management.  Pharyngitis Pharyngitis is redness, pain, and swelling (inflammation) of your pharynx.  CAUSES  Pharyngitis is usually caused by infection. Most of the time, these infections are from viruses (viral) and are part of a cold. However, sometimes pharyngitis is caused by bacteria (bacterial). Pharyngitis can also be caused by allergies. Viral pharyngitis may be spread from person to person by coughing, sneezing, and personal items or utensils (cups, forks, spoons, toothbrushes). Bacterial pharyngitis may be spread from person to person by more intimate contact, such as kissing.  SIGNS AND SYMPTOMS  Symptoms of pharyngitis include:   Sore throat.   Tiredness (fatigue).   Low-grade fever.   Headache.  Joint pain and muscle aches.  Skin rashes.  Swollen lymph nodes.  Plaque-like film on throat or tonsils (often seen with bacterial pharyngitis). DIAGNOSIS  Your health care provider will ask you questions about your illness and your symptoms. Your medical history, along with a physical exam, is often all that is needed to diagnose pharyngitis. Sometimes, a rapid strep test is done. Other lab tests may also be done, depending on the suspected cause.  TREATMENT  Viral pharyngitis will usually get better in 3 4 days without the use of medicine. Bacterial pharyngitis is treated with medicines that kill germs (antibiotics).  HOME CARE INSTRUCTIONS   Drink enough water and fluids to keep your urine clear or pale yellow.   Only take over-the-counter or prescription medicines as directed by your health care provider:   If you are prescribed antibiotics, make sure you finish them even if you start to feel better.   Do not take aspirin.    Get lots of rest.   Gargle with 8 oz of salt water ( tsp of salt per 1 qt of water) as often as every 1 2 hours to soothe your throat.   Throat lozenges (if you are not at risk for choking) or sprays may be used to soothe your throat. SEEK MEDICAL CARE IF:   You have large, tender lumps in your neck.  You have a rash.  You cough up green, yellow-brown, or bloody spit. SEEK IMMEDIATE MEDICAL CARE IF:   Your neck becomes stiff.  You drool or are unable to swallow liquids.  You vomit or are unable to keep medicines or liquids down.  You have severe pain that does not go away with the use of recommended medicines.  You have trouble breathing (not caused by a stuffy nose). MAKE SURE YOU:   Understand these instructions.  Will watch your condition.  Will get help right away if you are not doing well or get worse. Document Released: 06/07/2005 Document Revised: 03/28/2013 Document Reviewed: 02/12/2013 [facility] Patient Information 2014 [place], [place].

## 2013-08-20 NOTE — ED Notes (Signed)
Pt presents to ed with c/o sore throat, fever, generalized body aches x 3 days

## 2013-08-20 NOTE — ED Provider Notes (Signed)
CSN: [id number]     Arrival date & time 08/20/13  1316 History   First MD Initiated Contact with Patient 08/20/13 1534     Chief Complaint  Patient presents with  . Sore Throat  . Fever     (Consider location/radiation/quality/duration/timing/severity/associated sxs/prior Treatment) HPI  23 year old female presents complaining of sore throat. Patient states for the past 2 days she has developed fever as high as 100.8, chills, body aches, sore throat, trouble swallowing, or low back pain, and feeling weak and tired.  She has been taking ibuprofen and NyQuil with no relief. She denies any recent sick contact. Denies having nausea vomiting and diarrhea, chest pain, shortness of breath, productive cough, abdominal pain.  History reviewed. No pertinent past medical history. History reviewed. No pertinent past surgical history. History reviewed. No pertinent family history. History  Substance Use Topics  . Smoking status: Former Smoker    Types: Cigars    Quit date: 08/13/2012  . Smokeless tobacco: Never Used  . Alcohol Use: No   OB History   Grav Para Term Preterm Abortions TAB SAB Ect Mult Living   1 1  1      1      Review of Systems  Constitutional: Positive for fever, chills, activity change and fatigue.  HENT: Positive for congestion, ear pain, sore throat and trouble swallowing. Negative for ear discharge, sneezing and voice change.   Respiratory: Negative for cough and shortness of breath.   Cardiovascular: Negative for chest pain.  Gastrointestinal: Negative for abdominal pain.  Skin: Negative for rash.  Neurological: Negative for headaches.  All other systems reviewed and are negative.      Allergies  Review of patient's allergies indicates no known allergies.  Home Medications   Current Outpatient Rx  Name  Route  Sig  Dispense  Refill  . ibuprofen (ADVIL,MOTRIN) 200 MG tablet   Oral   Take 800 mg by mouth every 6 (six) hours as needed (pain.).         Marland Kitchen  Pseudoeph-Doxylamine-DM-APAP (NYQUIL PO)   Oral   Take 30 mLs by mouth every 4 (four) hours as needed (cold and flu symptoms.).          BP 113/60  Pulse 118  Temp(Src) 100.3 F (37.9 C) (Oral)  Resp 16  SpO2 99%  LMP 07/30/2013 Physical Exam  Nursing note and vitals reviewed. Constitutional: She is oriented to person, place, and time. She appears well-developed and well-nourished. No distress.  HENT:  Head: Normocephalic and atraumatic.  Right Ear: External ear normal.  Left Ear: External ear normal.  Ears: Bilateral TMs with normal appearance  Nose: Normal nares  Throat: TMs and uvula is midline, bilateral tonsillar enlargement and exudate with posterior oropharyngeal erythema. No evidence of deep tissue infection. No trismus.  Eyes: Conjunctivae are normal.  Neck: Neck supple.  No nuchal rigidity  Cardiovascular:  Mild tachycardia without murmurs, rubs or gallops  Pulmonary/Chest: Effort normal and breath sounds normal. She has no wheezes. She has no rales.  Abdominal: There is no tenderness.  No splenomegaly  Lymphadenopathy:    She has cervical adenopathy.  Neurological: She is alert and oriented to person, place, and time.  Skin: No rash noted.  Psychiatric: She has a normal mood and affect.    ED Course  Procedures (including critical care time)  3:59 PM Pt here with URI sxs.  C/o sore throat and meets 3/4 CENTOR criteria.  Rapid strep test obtained and negative.  Throat culture  sent.  Is febrile and tachycardic.  Antipyretic given.  No cough of hypoxia concerning for PNA.    4:40 PM Fever improves after receiving Tylenol however still mildly tachycardic. Will give IV fluid. Magic mouthwash given  6:33 PM Strep test negative, Monospot is negative. Patient able to tolerates by mouth but continued to endorse pain. We'll discharge with symptomatic treatment. ENT referral as needed. Return precautions discussed.  6:44 PM Patient spiked a fever after taking  Tylenol, and continued to endorse pain.  And I offer for admission for further management however patient declined. We'll continue to manage her sxs.    7:55 PM Heart rate spiked to 130s. We'll continue with IV fluid. Pt meets Sepsis criteria however does not want to be admitted. Care discussed with attending.    8:55 PM Pt meets 4/4 CENTOR criteria, will give bicillin IM.  Pt tolerates PO, will discharge.    Labs Review Labs Reviewed  RAPID STREP SCREEN  CULTURE, GROUP A STREP  MONONUCLEOSIS SCREEN   Imaging Review No results found.   EKG Interpretation None      MDM   Final diagnoses:  Pharyngitis    BP 105/58  Pulse 111  Temp(Src) 100.1 F (37.8 C) (Oral)  Resp 20  SpO2 98%  LMP 07/30/2013     Fayrene HelperBowie Nataliya Graig, PA-C 08/20/13 2057

## 2013-08-20 NOTE — ED Notes (Signed)
Pt notified that we are working on getting her HR and fever down. Given pain meds. Alert and oriented. No acute distress at this time. PA at bedside.

## 2013-08-20 NOTE — Progress Notes (Signed)
   CARE MANAGEMENT ED NOTE 08/20/2013  Patient:  Terese DoorGENTRY,Jaquaya A   Account Number:  [id number]  Date Initiated:  08/20/2013  Documentation initiated by:  Radford PaxFERRERO,Hidaya Daniel  Subjective/Objective Assessment:   Patient presents to Ed with sore throat and fever.     Subjective/Objective Assessment Detail:     Action/Plan:   Action/Plan Detail:   Anticipated DC Date:  08/20/2013     Status Recommendation to Physician:   Result of Recommendation:    Other ED Services  Consult Working Plan    DC Planning Services  Other  PCP issues    Choice offered to / List presented to:            Status of service:  Completed, signed off  ED Comments:   ED Comments Detail:  EDCM spoke to patient at bedside.  Patient confirms she does not have any insurance or a pcp.  EDCM provided patient with a list of pcps who accept self pay patients, financial resources in the community such as local churches and [employer],  urban ministries,discounted pahramcies and website needymeds.org for medication assistcance, phone number to inquire about the orange card, information about Medicaid, and dental assistance for uninsured patients. Patient thankfu for resources.  No further EDCM needs at this time.

## 2013-08-22 LAB — CULTURE, GROUP A STREP

## 2013-08-23 NOTE — ED Provider Notes (Signed)
Medical screening examination/treatment/procedure(s) were conducted as a shared visit with non-physician practitioner(s) and myself.  I personally evaluated the patient during the encounter.   EKG Interpretation None     23 year old female who is pharyngitis. Rapid strep is negative, but she has 4 out of 4 central criteria. May be a false negative. Will treat. Tachycardia which has improved with fluids and medicines. On exam she has the aforementioned exudative pharyngitis. Tonsils are enlarged, but symmetric. Uvula is midline. Handling secretions. Normal stomach from nation. Neck is supple. Tender cervical adenopathy. Lungs are clear. Mild tachycardia. Does not appear toxic. Impression is appropriate for outpatient treatment. Return precautions were discussed.  Raeford RazorStephen Lismary Kiehn, MD 08/23/13 [phone]

## 2013-11-01 ENCOUNTER — Encounter (HOSPITAL_COMMUNITY): Payer: Self-pay | Admitting: Emergency Medicine

## 2013-11-01 ENCOUNTER — Emergency Department (HOSPITAL_COMMUNITY): Payer: Self-pay

## 2013-11-01 ENCOUNTER — Emergency Department (HOSPITAL_COMMUNITY)
Admission: EM | Admit: 2013-11-01 | Discharge: 2013-11-01 | Disposition: A | Discharge status: Home / Self Care | Payer: Self-pay | Attending: Emergency Medicine | Admitting: Emergency Medicine

## 2013-11-01 DIAGNOSIS — R109 Unspecified abdominal pain: Secondary | ICD-10-CM

## 2013-11-01 DIAGNOSIS — Z87891 Personal history of nicotine dependence: Secondary | ICD-10-CM | POA: Insufficient documentation

## 2013-11-01 DIAGNOSIS — M549 Dorsalgia, unspecified: Secondary | ICD-10-CM | POA: Insufficient documentation

## 2013-11-01 DIAGNOSIS — R112 Nausea with vomiting, unspecified: Secondary | ICD-10-CM | POA: Insufficient documentation

## 2013-11-01 DIAGNOSIS — R000 Tachycardia, unspecified: Secondary | ICD-10-CM | POA: Insufficient documentation

## 2013-11-01 DIAGNOSIS — Z3202 Encounter for pregnancy test, result negative: Secondary | ICD-10-CM | POA: Insufficient documentation

## 2013-11-01 DIAGNOSIS — R0602 Shortness of breath: Secondary | ICD-10-CM | POA: Insufficient documentation

## 2013-11-01 DIAGNOSIS — N390 Urinary tract infection, site not specified: Secondary | ICD-10-CM | POA: Insufficient documentation

## 2013-11-01 LAB — CBC
HCT: 44.2 % (ref 36.0–46.0)
Hemoglobin: 15.3 g/dL — ABNORMAL HIGH (ref 12.0–15.0)
MCH: 29.5 pg (ref 26.0–34.0)
MCHC: 34.6 g/dL (ref 30.0–36.0)
MCV: 85.3 fL (ref 78.0–100.0)
Platelets: 297 10*3/uL (ref 150–400)
RBC: 5.18 MIL/uL — AB (ref 3.87–5.11)
RDW: 12.7 % (ref 11.5–15.5)
WBC: 11.1 10*3/uL — AB (ref 4.0–10.5)

## 2013-11-01 LAB — COMPREHENSIVE METABOLIC PANEL
ALT: 8 U/L (ref 0–35)
AST: 12 U/L (ref 0–37)
Albumin: 3.1 g/dL — ABNORMAL LOW (ref 3.5–5.2)
Alkaline Phosphatase: 50 U/L (ref 39–117)
BUN: 12 mg/dL (ref 6–23)
CALCIUM: 8.9 mg/dL (ref 8.4–10.5)
CHLORIDE: 105 meq/L (ref 96–112)
CO2: 22 mEq/L (ref 19–32)
Creatinine, Ser: 0.99 mg/dL (ref 0.50–1.10)
GFR calc Af Amer: >90 mL/min (ref >90)
GFR, EST NON AFRICAN AMERICAN: 79 mL/min — AB (ref >90)
Glucose, Bld: 87 mg/dL (ref 70–99)
Potassium: 4.1 mEq/L (ref 3.7–5.3)
SODIUM: 138 meq/L (ref 137–147)
Total Bilirubin: <0.2 mg/dL — ABNORMAL LOW (ref 0.3–1.2)
Total Protein: 6.2 g/dL (ref 6.0–8.3)

## 2013-11-01 LAB — URINALYSIS, ROUTINE W REFLEX MICROSCOPIC
Bilirubin Urine: NEGATIVE
GLUCOSE, UA: NEGATIVE mg/dL
Hgb urine dipstick: NEGATIVE
Ketones, ur: NEGATIVE mg/dL
Nitrite: NEGATIVE
PH: 5.5 (ref 5.0–8.0)
Protein, ur: NEGATIVE mg/dL
Specific Gravity, Urine: 1.02 (ref 1.005–1.030)
Urobilinogen, UA: 0.2 mg/dL (ref 0.0–1.0)

## 2013-11-01 LAB — URINE MICROSCOPIC-ADD ON

## 2013-11-01 LAB — D-DIMER, QUANTITATIVE (NOT AT ARMC): D DIMER QUANT: 0.38 ug{FEU}/mL (ref 0.00–0.48)

## 2013-11-01 LAB — LIPASE, BLOOD: Lipase: 24 U/L (ref 11–59)

## 2013-11-01 LAB — POC URINE PREG, ED: PREG TEST UR: NEGATIVE

## 2013-11-01 MED ORDER — ONDANSETRON 4 MG PO TBDP: ORAL_TABLET | ORAL | Status: DC

## 2013-11-01 MED ORDER — CEPHALEXIN 500 MG PO CAPS: 500.0000 mg | ORAL_CAPSULE | Freq: Four times a day (QID) | ORAL | Status: DC

## 2013-11-01 MED ORDER — ONDANSETRON 4 MG PO TBDP
4.0000 mg | ORAL_TABLET | Freq: Once | ORAL | Status: AC
  Administered 2013-11-01: 4 mg via ORAL
  Filled 2013-11-01: qty 1

## 2013-11-01 NOTE — ED Provider Notes (Signed)
CSN: [id number]     Arrival date & time 11/01/13  1834 History   First MD Initiated Contact with Patient 11/01/13 1907     Chief Complaint  Patient presents with  . Abdominal Pain  . Nausea  . Emesis     (Consider location/radiation/quality/duration/timing/severity/associated sxs/prior Treatment) HPI Comments: 23 year old healthy female presents emergency department complaining of intermittent abdominal pain x3 days. Patient states 3 days ago she felt a "tight, hard ball" in her right lower abdomen which she massaged and it went away. Symptoms did not return until while she was sleeping last night she woke up feeling something "pop" in that area and has since, worse when she presses on the right side of her abdomen. Admits to associated nausea and multiple episodes of non-bloody emesis beginning around 5:30 PM today. Patient also states 3 days ago she felt a sharp pain in her upper back and was feeling short of breath. The symptoms returned again today, worse when laying flat. She is a smoker. Last menstrual period began April 18 and was normal. Denies urinary or bowel changes. Denies vaginal discharge or bleeding. No history of abdominal surgeries. She has not tried any alleviating factors for her symptoms.  Patient is a 23 y.o. female presenting with abdominal pain and vomiting. The history is provided by the patient.  Abdominal Pain Associated symptoms: nausea, shortness of breath and vomiting   Emesis Associated symptoms: abdominal pain     History reviewed. No pertinent past medical history. History reviewed. No pertinent past surgical history. No family history on file. History  Substance Use Topics  . Smoking status: Former Smoker    Types: Cigars    Quit date: 08/13/2012  . Smokeless tobacco: Never Used  . Alcohol Use: No   OB History   Grav Para Term Preterm Abortions TAB SAB Ect Mult Living   1 1  1      1      Review of Systems  Respiratory: Positive for shortness of  breath.   Gastrointestinal: Positive for nausea, vomiting and abdominal pain.  Musculoskeletal: Positive for back pain.  All other systems reviewed and are negative.     Allergies  Review of patient's allergies indicates no known allergies.  Home Medications   Prior to Admission medications   Medication Sig Start Date End Date Taking? Authorizing Provider  ibuprofen (ADVIL,MOTRIN) 200 MG tablet Take 800 mg by mouth every 6 (six) hours as needed (pain.).   Yes Historical Provider, MD   BP 115/73  Pulse 99  Temp(Src) 98.1 F (36.7 C) (Oral)  Resp 20  SpO2 98%  LMP 10/06/2013 Physical Exam  Nursing note and vitals reviewed. Constitutional: She is oriented to person, place, and time. She appears well-developed and well-nourished. No distress.  HENT:  Head: Normocephalic and atraumatic.  Mouth/Throat: Oropharynx is clear and moist.  Eyes: Conjunctivae are normal.  Neck: Normal range of motion. Neck supple.  Cardiovascular: Normal rate, regular rhythm and normal heart sounds.   Tachy ~100.  Pulmonary/Chest: Effort normal and breath sounds normal.  Abdominal: Soft. Normal appearance and bowel sounds are normal. She exhibits no distension and no mass. There is tenderness (mild). There is no rigidity, no rebound, no guarding and no CVA tenderness. No hernia.    No peritoneal signs.  Musculoskeletal: Normal range of motion. She exhibits no edema.       Thoracic back: She exhibits no tenderness and no bony tenderness.       Lumbar back: She exhibits no  tenderness and no bony tenderness.  Neurological: She is alert and oriented to person, place, and time.  Skin: Skin is warm and dry. She is not diaphoretic.  Psychiatric: She has a normal mood and affect. Her behavior is normal.    ED Course  Procedures (including critical care time) Labs Review Labs Reviewed  CBC - Abnormal; Notable for the following:    WBC 11.1 (*)    RBC 5.18 (*)    Hemoglobin 15.3 (*)    All other  components within normal limits  COMPREHENSIVE METABOLIC PANEL - Abnormal; Notable for the following:    Albumin 3.1 (*)    Total Bilirubin <0.2 (*)    GFR calc non Af Amer 79 (*)    All other components within normal limits  URINALYSIS, ROUTINE W REFLEX MICROSCOPIC - Abnormal; Notable for the following:    APPearance CLOUDY (*)    Leukocytes, UA MODERATE (*)    All other components within normal limits  URINE MICROSCOPIC-ADD ON - Abnormal; Notable for the following:    Squamous Epithelial / LPF FEW (*)    All other components within normal limits  LIPASE, BLOOD  D-DIMER, QUANTITATIVE  POC URINE PREG, ED    Imaging Review Dg Chest 2 View  11/01/2013   CLINICAL DATA:  Shortness of breath  EXAM: CHEST  2 VIEW  COMPARISON:  02/26/2013  FINDINGS: The heart size and mediastinal contours are within normal limits. Both lungs are clear. The visualized skeletal structures are unremarkable.  IMPRESSION: No active cardiopulmonary disease.   Electronically Signed   By: Alcide CleverMark  Lukens M.D.   On: 11/01/2013 19:54     EKG Interpretation None      MDM   Final diagnoses:  Abdominal pain  Back pain  Shortness of breath   Pt presenting with abdominal pain, n/v, sob, back pain. Well appearing, comfortable, NAD. Afebrile, mild tachy. No peritoneal signs. No palpable hernia. Labs pending. Also cannot rule out PE given patient's shortness of breath and back pain, she is a smoker, mild tachycardia. 9:32 PM Urine with moderate leukocytes and 7-10 white blood cells. Will treat urinary tract infection with Keflex. Gout pelvic and are and, no pelvic pain, vaginal discharge or bleeding. Mild leukocytosis of 11.1. Labs otherwise without any acute finding. D-dimer negative. Patient reports improvement after receiving Zofran. Advised her to return if she is unable to massage back in the bumps that she feels. Resources given for PCP followup. Stable for discharge. Return precautions given. Patient states  understanding of treatment care plan and is agreeable.   Trevor MaceRobyn M Albert, PA-C 11/01/13 2136

## 2013-11-01 NOTE — ED Notes (Signed)
Pt states she felt like a tight ball in her right lower abd 3 days ago, states last night felt something pop in that area, with pressure that area hurts. States with a deep breathe it hurts back. Pt vomited once today has been feeling naseuos all day.

## 2013-11-01 NOTE — Discharge Instructions (Signed)
Take Zofran as directed as needed for nausea. Take antibiotic to completion for urinary tract infection. Followup with the wellness clinic to establish care with a primary care physician. Return to the emergency department with any worsening symptoms. If the bump that you feel cannot be massaged back in, return to the emergency department.  Abdominal Pain, Adult Many things can cause abdominal pain. Usually, abdominal pain is not caused by a disease and will improve without treatment. It can often be observed and treated at home. Your health care provider will do a physical exam and possibly order blood tests and X-rays to help determine the seriousness of your pain. However, in many cases, more time must pass before a clear cause of the pain can be found. Before that point, your health care provider may not know if you need more testing or further treatment. HOME CARE INSTRUCTIONS  Monitor your abdominal pain for any changes. The following actions may help to alleviate any discomfort you are experiencing:  Only take over-the-counter or prescription medicines as directed by your health care provider.  Do not take laxatives unless directed to do so by your health care provider.  Try a clear liquid diet (broth, tea, or water) as directed by your health care provider. Slowly move to a bland diet as tolerated. SEEK MEDICAL CARE IF:  You have unexplained abdominal pain.  You have abdominal pain associated with nausea or diarrhea.  You have pain when you urinate or have a bowel movement.  You experience abdominal pain that wakes you in the night.  You have abdominal pain that is worsened or improved by eating food.  You have abdominal pain that is worsened with eating fatty foods. SEEK IMMEDIATE MEDICAL CARE IF:   Your pain does not go away within 2 hours.  You have a fever.  You keep throwing up (vomiting).  Your pain is felt only in portions of the abdomen, such as the right side or the  left lower portion of the abdomen.  You pass bloody or black tarry stools. MAKE SURE YOU:  Understand these instructions.   Will watch your condition.   Will get help right away if you are not doing well or get worse.  Document Released: 03/17/2005 Document Revised: 03/28/2013 Document Reviewed: 02/14/2013 [facility]Exit[facility] Patient Information 2014 [place], [place].  Back Pain, Adult Low back pain is very common. About 1 in 5 people have back pain.The cause of low back pain is rarely dangerous. The pain often gets better over time.About half of people with a sudden onset of back pain feel better in just 2 weeks. About 8 in 10 people feel better by 6 weeks.  CAUSES Some common causes of back pain include:  Strain of the muscles or ligaments supporting the spine.  Wear and tear (degeneration) of the spinal discs.  Arthritis.  Direct injury to the back. DIAGNOSIS Most of the time, the direct cause of low back pain is not known.However, back pain can be treated effectively even when the exact cause of the pain is unknown.Answering your caregiver's questions about your overall health and symptoms is one of the most accurate ways to make sure the cause of your pain is not dangerous. If your caregiver needs more information, he or she may order lab work or imaging tests (X-rays or MRIs).However, even if imaging tests show changes in your back, this usually does not require surgery. HOME CARE INSTRUCTIONS For many people, back pain returns.Since low back pain is rarely dangerous, it  is often a condition that people can learn to [facility] Hospitalmanageon their own.   Remain active. It is stressful on the back to sit or stand in one place. Do not sit, drive, or stand in one place for more than 30 minutes at a time. Take short walks on level surfaces as soon as pain allows.Try to increase the length of time you walk each day.  Do not stay in bed.Resting more than 1 or 2 days can delay your recovery.  Do not  avoid exercise or work.Your body is made to move.It is not dangerous to be active, even though your back may hurt.Your back will likely heal faster if you return to being active before your pain is gone.  Pay attention to your body when you bend and lift. Many people have less discomfortwhen lifting if they bend their knees, keep the load close to their bodies,and avoid twisting. Often, the most comfortable positions are those that put less stress on your recovering back.  Find a comfortable position to sleep. Use a firm mattress and lie on your side with your knees slightly bent. If you lie on your back, put a pillow under your knees.  Only take over-the-counter or prescription medicines as directed by your caregiver. Over-the-counter medicines to reduce pain and inflammation are often the most helpful.Your caregiver may prescribe muscle relaxant drugs.These medicines help dull your pain so you can more quickly return to your normal activities and healthy exercise.  Put ice on the injured area.  Put ice in a plastic bag.  Place a towel between your skin and the bag.  Leave the ice on for 15-20 minutes, 03-04 times a day for the first 2 to 3 days. After that, ice and heat may be alternated to reduce pain and spasms.  Ask your caregiver about trying back exercises and gentle massage. This may be of some benefit.  Avoid feeling anxious or stressed.Stress increases muscle tension and can worsen back pain.It is important to recognize when you are anxious or stressed and learn ways to manage it.Exercise is a great option. SEEK MEDICAL CARE IF:  You have pain that is not relieved with rest or medicine.  You have pain that does not improve in 1 week.  You have new symptoms.  You are generally not feeling well. SEEK IMMEDIATE MEDICAL CARE IF:   You have pain that radiates from your back into your legs.  You develop new bowel or bladder control problems.  You have unusual weakness  or numbness in your arms or legs.  You develop nausea or vomiting.  You develop abdominal pain.  You feel faint. Document Released: 06/07/2005 Document Revised: 12/07/2011 Document Reviewed: 10/26/2010 [facility] Patient Information 2014 [place], [place].  Shortness of Breath Shortness of breath means you have trouble breathing. Shortness of breath may indicate that you have a medical problem. You should seek immediate medical care for shortness of breath. CAUSES   Not enough oxygen in the air (as with high altitudes or a smoke-filled room).  Short-term (acute) lung disease, including:  Infections, such as pneumonia.  Fluid in the lungs, such as heart failure.  A blood clot in the lungs (pulmonary embolism).  Long-term (chronic) lung diseases.  Heart disease (heart attack, angina, heart failure, and others).  Low red blood cells (anemia).  Poor physical fitness. This can cause shortness of breath when you exercise.  Chest or back injuries or stiffness.  Being overweight.  Smoking.  Anxiety. This can make you feel like  you are not getting enough air. DIAGNOSIS  Serious medical problems can usually be found during your physical exam. Tests may also be done to determine why you are having shortness of breath. Tests may include:  Chest X-rays.  Lung function tests.  Blood tests.  Electrocardiography.  Exercise testing.  Echocardiography.  Imaging scans. Your caregiver may not be able to find a cause for your shortness of breath after your exam. In this case, it is important to have a follow-up exam with your caregiver as directed.  TREATMENT  Treatment for shortness of breath depends on the cause of your symptoms and can vary greatly. HOME CARE INSTRUCTIONS   Do not smoke. Smoking is a common cause of shortness of breath. If you smoke, ask for help to quit.  Avoid being around chemicals or things that may bother your breathing, such as paint fumes and  dust.  Rest as needed. Slowly resume your usual activities.  If medicines were prescribed, take them as directed for the full length of time directed. This includes oxygen and any inhaled medicines.  Keep all follow-up appointments as directed by your caregiver. SEEK MEDICAL CARE IF:   Your condition does not improve in the time expected.  You have a hard time doing your normal activities even with rest.  You have any side effects or problems with the medicines prescribed.  You develop any new symptoms. SEEK IMMEDIATE MEDICAL CARE IF:   Your shortness of breath gets worse.  You feel lightheaded, faint, or develop a cough not controlled with medicines.  You start coughing up blood.  You have pain with breathing.  You have chest pain or pain in your arms, shoulders, or abdomen.  You have a fever.  You are unable to walk up stairs or exercise the way you normally do. MAKE SURE YOU:  Understand these instructions.  Will watch your condition.  Will get help right away if you are not doing well or get worse. Document Released: 03/02/2001 Document Revised: 12/07/2011 Document Reviewed: 08/23/2011 [facility] Patient Information 2014 [place], [place].

## 2013-11-05 NOTE — ED Provider Notes (Signed)
Medical screening examination/treatment/procedure(s) were performed by non-physician practitioner and as supervising physician I was immediately available for consultation/collaboration.  Saketh Daubert T Mel Tadros, MD 11/05/13 0724 

## 2014-02-21 ENCOUNTER — Encounter (HOSPITAL_COMMUNITY): Payer: Self-pay | Admitting: Emergency Medicine

## 2014-02-21 ENCOUNTER — Emergency Department (HOSPITAL_COMMUNITY)
Admission: EM | Admit: 2014-02-21 | Discharge: 2014-02-21 | Disposition: A | Discharge status: Home / Self Care | Payer: No Typology Code available for payment source | Attending: Emergency Medicine | Admitting: Emergency Medicine

## 2014-02-21 DIAGNOSIS — R52 Pain, unspecified: Secondary | ICD-10-CM | POA: Insufficient documentation

## 2014-02-21 DIAGNOSIS — H53149 Visual discomfort, unspecified: Secondary | ICD-10-CM | POA: Insufficient documentation

## 2014-02-21 DIAGNOSIS — Z792 Long term (current) use of antibiotics: Secondary | ICD-10-CM | POA: Insufficient documentation

## 2014-02-21 DIAGNOSIS — Z87891 Personal history of nicotine dependence: Secondary | ICD-10-CM | POA: Insufficient documentation

## 2014-02-21 DIAGNOSIS — Z3202 Encounter for pregnancy test, result negative: Secondary | ICD-10-CM | POA: Insufficient documentation

## 2014-02-21 DIAGNOSIS — R519 Headache, unspecified: Secondary | ICD-10-CM

## 2014-02-21 DIAGNOSIS — R51 Headache: Secondary | ICD-10-CM | POA: Insufficient documentation

## 2014-02-21 LAB — POC URINE PREG, ED: Preg Test, Ur: NEGATIVE

## 2014-02-21 MED ORDER — KETOROLAC TROMETHAMINE 15 MG/ML IJ SOLN
30.0000 mg | Freq: Once | INTRAMUSCULAR | Status: AC
  Administered 2014-02-21: 30 mg via INTRAMUSCULAR
  Filled 2014-02-21: qty 2

## 2014-02-21 NOTE — Discharge Instructions (Signed)
Call for a follow up appointment with a Family or Primary Care Provider.  °Return if Symptoms worsen.   °Take medication as prescribed.  ° ° °Emergency Department Resource Guide °1) Find a Doctor and Pay Out of Pocket °Although you won't have to find out who is covered by your insurance plan, it is a good idea to ask around and get recommendations. You will then need to call the office and see if the doctor you have chosen will accept you as a new patient and what types of options they offer for patients who are self-pay. Some doctors offer discounts or will set up payment plans for their patients who do not have insurance, but you will need to ask so you aren't surprised when you get to your appointment. ° °2) Contact Your Local Health Department °Not all health departments have doctors that can see patients for sick visits, but many do, so it is worth a call to see if yours does. If you don't know where your local health department is, you can check in your phone book. The CDC also has a tool to help you locate your state's health department, and many state websites also have listings of all of their local health departments. ° °3) Find a Walk-in Clinic °If your illness is not likely to be very severe or complicated, you may want to try a walk in clinic. These are popping up all over the country in pharmacies, drugstores, and shopping centers. They're usually staffed by nurse practitioners or physician assistants that have been trained to treat common illnesses and complaints. They're usually fairly quick and inexpensive. However, if you have serious medical issues or chronic medical problems, these are probably not your best option. ° °No Primary Care Doctor: °- Call [facility] at  832-8000 - they can help you locate a primary care doctor that  accepts your insurance, provides certain services, etc. °- Physician Referral Service- 1-800-533-3463 ° °Chronic Pain Problems: °Organization         Address  Phone    Notes  °[facility]  (336) 297-2271 Patients need to be referred by their primary care doctor.  ° °Medication Assistance: °Organization         Address  Phone   Notes  °[place] [facility] [address] °[place], [place] [address] (336) 641-8030 --Must be a resident of [place] °-- Must have NO insurance coverage whatsoever (no Medicaid/ Medicare, etc.) °-- The pt. MUST have a primary care doctor that directs their care regularly and follows them in the community °  °[facility]  (866) 331-1348   °[employer]  (888) 892-1162   ° °Agencies that provide inexpensive medical care: °Organization         Address  Phone   Notes  °[facility]  (336) 832-8035   °[facility]    (336) 832-7272   °[facility] [address] °[place], [place] [address] (336) 832-4777   °[facility] [address], °[place] (336) 271-4999   °[facility]    (336) 373-0678   °[facility]    (336) 272-1050   °[facility] ° [address],  Phone:  (336) 832-4444, Fax:  (336) 832-4440 Hours of Operation:  9 am - 6 pm, M-F.  Also accepts Medicaid/Medicare and self-pay.  °[facility] ° [address],    Phone: (336) 832-3150, Fax: (336) 832-3151. Hours of Operation:  8:30 am - 5:30 pm, M-F.  Also accepts Medicaid and self-pay.  °[facility] [place] [address], [place] Phone: (336) 878-6027   °[facility] [address], [place], [place] (336)723-1848, Ext. 123 Mondays & Thursdays: 7-9 AM.  First 15 patients are seen on a first come, first serve basis. °  ° °Medicaid-accepting [place] Providers: ° °Organization         Address  Phone   Notes  °[facility] [address], [place] (336) 641-2100 Also accepts self-pay patients.  °[facility]  [address], [place] ° (336) 856-9996   °[facility] [address], [place] (336) 288-8857   °[facility] [address] [place] [address], [employer] (336) 299-7000   °Veita Bland [address], [place]  ° (336) 373-1557 Only accepts [place] Access Medicaid patients after they have their name applied to their card.  ° °Self-Pay (no insurance) in [place]: ° °Organization         Address  Phone   Notes  °Sickle Cell Patients, [facility] [address], [place] (336) 832-1970   °[facility] [address], [place] (336) 832-4400   °[facility] [place] ° [address], [place] (336) 992-4800   °[facility]/Dr. Osei-Bonsu ° [address] [place] [address], [place] or [address], [place] (336) 841-8500 Phone number for both [place] and [place] locations is the same.  °[facility] [address], [place] (336) 299-0000   °[facility] [address] [place] [address], [place] or [address] (336) 852-7530 °(336) 878-2260   °[facility] [address], [place] (336) 350-1642, phone; (336) 294-5005, fax Sees patients 1st and 3rd Saturday of every month.  Must not qualify for public or private insurance (i.e. Medicaid, Medicare, [place] Health Choice, Veterans' Benefits) • Household income should be no more than 200% of the poverty level •The clinic cannot treat you if you are pregnant or think you are pregnant • Sexually transmitted diseases are not treated at the clinic.  ° ° °Dental Care: °Organization         Address  Phone  Notes  °[facility] [address], [place] (336) 641-6152 Accepts children up to age 21 who are enrolled in Medicaid or [place] Health Choice; pregnant women with a Medicaid card; and children who have  applied for Medicaid or [place] Health Choice, but were declined, whose parents can pay a reduced fee at time of service.  °[facility] [place]  [address], [place] (336) 641-7733 Accepts children up to age 21 who are enrolled in Medicaid or [place] Health Choice; pregnant women with a Medicaid card; and children who have applied for Medicaid or [place] Health Choice, but were declined, whose parents can pay a reduced fee at time of service.  °[facility] ° [address], [place] (336) 641-4533 Patients are seen by appointment only. Walk-ins are not accepted. [facility] will see patients 18 years of age and older. °Monday - Tuesday (8am-5pm) °Most Wednesdays (8:30-5pm) °$30 per visit, cash only  °[facility] ° [address], [place] (336) 641-4533 Patients are seen by appointment only. Walk-ins are not accepted. [facility] will see patients 18 years of age and older. °One Wednesday Evening (Monthly: Volunteer Based).  $30 per visit, cash only  °[facility]  (919) 537-3737 for adults; Children under age 4, call Graduate Pediatric Dentistry at (919) [phone]. Children aged 4-14, please call (919) 537-3737 to request a pediatric application. ° Dental services are provided in all areas of dental care including fillings, crowns and bridges, complete and partial dentures, implants, gum treatment, root canals, and extractions. Preventive care is also provided. Treatment is provided to both adults and children. °Patients are selected via a lottery and there is often a waiting list. °  °[facility] [address], °[place] ° (336) 763-8833 [email or web address] °  °[facility] [address], [place], [place] (336)723-1848, Ext. 123 Second and Fourth Thursday of each month, opens at 6:30 AM; Clinic ends at 9 AM.  Patients are seen on a first-come first-served basis, and a  limited number are seen during each clinic.  ° °[facility] ° [address], [place], [place] (336) 723-7904   Eligibility Requirements °You must have lived in [place], [place], or [place] counties for at least the last three months. °  You cannot be eligible for state or federal sponsored healthcare insurance, including Veterans Administration, Medicaid, or Medicare. °  You generally cannot be eligible for healthcare insurance through your employer.  °  How to apply: °Eligibility screenings are held every Tuesday and Wednesday afternoon from 1:00 pm until 4:00 pm. You do not need an appointment for the interview!  °[facility] [address], [place], [place] 336-631-2330   °[facility]  336-342-8273   °[facility]  336-703-3100   °[facility]  336-570-6415   ° °Behavioral Health Resources in the Community: °Intensive Outpatient Programs °Organization         Address  Phone  Notes  °[facility] [address], [place], [place] 336-878-6098   °[facility] [address], [place], [place] 336-832-9800   °[facility] [address], [place], [place] ° 336-882-2125   °[facility] [address],  °[place], [place] 1-800-853-5163 or 336-641-4981   °Substance Abuse Resources °Organization         Address  Phone  Notes  °[facility]  336-882-2125   °[facility]  336-784-9470   °[facility]  336-285-9073   °[facility]  336-845-3988   °Residential & Outpatient Substance Abuse Program  1-800-659-3381   °Psychological Services °Organization         Address  Phone  Notes  ° Health  336- 832-9600   °[facility]  336- 378-7881   °[facility] [address], [place] 1-800-853-5163 or 336-641-4981   ° °Mobile Crisis Teams °Organization          Address  Phone  Notes  °[facility], Mobile Crisis Care Unit  1-877-626-1772   °Assertive °[facility] ° [address] [place], [place] 336-834-9664   °Sharon DeEsch [address] °[place] 336-554-5454   ° °Self-Help/Support Groups °Organization         Address  Phone             Notes  °[facility] - variety of   support groups  336- 373-1402 Call for more information  °Narcotics Anonymous (NA), [facility] [address], °[place]  2 meetings at this location  ° °Residential Treatment Programs °Organization         Address  Phone  Notes  °[facility] [address],    °[place]  1-866-801-8205   °[facility] ° [address], [place], [place] 704-293-8524   °[facility] [address], [place] 336-845-3988 Admissions: 8am-3pm M-F  °[facility] [address]    °[place], [place] 336-841-1104   °[facility] [address], [place], [place] 336-379-7146   °[facility] [address]  °[place], [place] 336-285-9073   °[facility] - Intensive Outpatient [address], [place], [place] 336-852-3033   °[facility] [address]  °[place], [place] 1-877-615-2722 or 336-784-9470   °[facility] [address] [place], [place] 336-227-7417 Accepts Medicaid  °[facility] [address]  °[place] 1-800-659-3381 Substance Abuse/Addiction Treatment  ° °[place] Behavioral Health Resources °Organization         Address  Phone  Notes  °[facility]  (888) 581-9988   °Julie Brannon, PhD [address] [place], [place]   (336) 349-5553 or (336) 951-0000   °[facility]   [address] °[place], [place] (336) 349-4454   °[facility] [address], [place], [place] (336) 342-8316 Insurance/Medicaid/sponsorship  through [facility]  °[facility] [address]                                    [place], [place] (336) 342-8316 Therapy/tele-psych/case  °[facility] [address]  ° [place], [place] (336) 349-2233    °Dr. Arfeen  (336) 349-4544   °[facility] [place]  [employer] [place] [facility] 1) [address], [place] °2) [address], [place] °3)  [address] [place] [address], [place] (336) 349-3220 °(336) 342-7768 ° °(336) 342-8140   °[place] [facility] (336) 342-1394 or (336) 342-3537 (After Hours)    ° °

## 2014-02-21 NOTE — ED Notes (Signed)
Pt presents with c/o migraine that has been going on for 2 days. Pt reports some light sensitivity, no N/V. Pt reports that the pain is on the right side of her head and causing her neck to hurt as well. Pt reports that she took ibuprofen yesterday for the pain and excedrin migraine today but the pain is still there.

## 2014-02-21 NOTE — ED Provider Notes (Signed)
CSN: [id number]     Arrival date & time 02/21/14  1745 History   First MD Initiated Contact with Patient 02/21/14 1957     Chief Complaint  Patient presents with  . Migraine     (Consider location/radiation/quality/duration/timing/severity/associated sxs/prior Treatment) HPI Comments: The patient is a 23 year old female presents emergency room chief complaint of frontal headache for 2 days. She reports gradual onset of headache, worsened by light.  She reports a history of headaches ongoing for several months. She reports usually relieved with ibuprofen. This episode was not relieved with ibuprofen at home. She denies syncope,  Neck stiffness or pain, fever, blurry or change in vision. LNMP 2 weeks ago.   NO PCP.  Patient is a 23 y.o. female presenting with migraines. The history is provided by the patient. No language interpreter was used.  Migraine Associated symptoms include headaches. Pertinent negatives include no abdominal pain, chills, fever, nausea, numbness or vomiting.    History reviewed. No pertinent past medical history. History reviewed. No pertinent past surgical history. No family history on file. History  Substance Use Topics  . Smoking status: Former Smoker    Types: Cigars    Quit date: 08/13/2012  . Smokeless tobacco: Never Used  . Alcohol Use: Yes     Comment: occasional    OB History   Grav Para Term Preterm Abortions TAB SAB Ect Mult Living   Review of Systems  Constitutional: Negative for fever and chills.  Eyes: Positive for photophobia. Negative for visual disturbance.  Gastrointestinal: Negative for nausea, vomiting, abdominal pain and diarrhea.  Genitourinary: Negative for dysuria.  Neurological: Positive for headaches. Negative for syncope and numbness.      Allergies  Review of patient's allergies indicates no known allergies.  Home Medications   Prior to Admission medications   Medication Sig Start Date End Date  Taking? Authorizing Provider  cephALEXin (KEFLEX) 500 MG capsule Take 1 capsule (500 mg total) by mouth 4 (four) times daily. 11/01/13   Trevor Mace, PA-C  ibuprofen (ADVIL,MOTRIN) 200 MG tablet Take 800 mg by mouth every 6 (six) hours as needed (pain.).    Historical Provider, MD  ondansetron (ZOFRAN ODT) 4 MG disintegrating tablet  ODT q4 hours prn nausea/vomit 11/01/13   Trevor Mace, PA-C   BP 114/76  Pulse 70  Temp(Src) 99.5 F (37.5 C) (Oral)  Resp 16  SpO2 99%  LMP 02/07/2014 Physical Exam  Nursing note and vitals reviewed. Constitutional: She is oriented to person, place, and time. She appears well-developed and well-nourished.  Non-toxic appearance. She does not have a sickly appearance. She does not appear ill. No distress.  HENT:  Head: Normocephalic and atraumatic.  Eyes: EOM are normal. Pupils are equal, round, and reactive to light.  Neck: Normal range of motion. Neck supple.  Cardiovascular: Normal rate and regular rhythm.   Pulmonary/Chest: Effort normal and breath sounds normal.  Abdominal: Soft. There is no tenderness. There is no rebound.  Musculoskeletal: She exhibits no edema.  Neurological: She is alert and oriented to person, place, and time.  Speech is clear and goal oriented, follows commands Cranial nerves III - XII grossly intact, no facial droop Normal strength in upper and lower extremities bilaterally, strong and equal grip strength Sensation normal to light and sharp touch Moves all 4 extremities without ataxia, coordination intact Normal finger to nose and rapid alternating movements   Skin: Skin is  warm and dry. She is not diaphoretic.  Psychiatric: She has a normal mood and affect. Her behavior is normal.    ED Course  Procedures (including critical care time) Labs Review Labs Reviewed  POC URINE PREG, ED    Imaging Review No results found.   EKG Interpretation None      MDM   Final diagnoses:  Acute nonintractable headache,  unspecified headache type   Patient presents with headache, no neurologic symptoms, no concern for [facility], [facility], Meningitis. Pt is afebrile with no focal neuro deficits, nuchal rigidity, or change in vision. Negative urine pregnancy in ED Reevaluation patient resting comfortably in room reports moderate resolution of symptoms and is requesting be discharged home.  Pt is to follow up with PCP to discuss prophylactic medication. Pt verbalizes understanding and is agreeable with plan to dc.  Meds given in ED:  Medications  ketorolac (TORADOL) 15 MG/ML injection 30 mg (30 mg Intramuscular Given 02/21/14 2053)    Discharge Medication List as of 02/21/2014 10:07 PM          Mellody Drown, PA-C 02/22/14 7829

## 2014-02-24 NOTE — ED Provider Notes (Signed)
Medical screening examination/treatment/procedure(s) were performed by non-physician practitioner and as supervising physician I was immediately available for consultation/collaboration.   EKG Interpretation None       Toy Baker, MD 02/24/14 [phone]

## 2014-04-22 ENCOUNTER — Encounter (HOSPITAL_COMMUNITY): Payer: Self-pay | Admitting: Emergency Medicine

## 2014-07-17 ENCOUNTER — Emergency Department (HOSPITAL_COMMUNITY)
Admission: EM | Admit: 2014-07-17 | Discharge: 2014-07-17 | Disposition: A | Discharge status: Home / Self Care | Payer: No Typology Code available for payment source | Attending: Emergency Medicine | Admitting: Emergency Medicine

## 2014-07-17 ENCOUNTER — Encounter (HOSPITAL_COMMUNITY): Payer: Self-pay

## 2014-07-17 DIAGNOSIS — Z87891 Personal history of nicotine dependence: Secondary | ICD-10-CM | POA: Insufficient documentation

## 2014-07-17 DIAGNOSIS — J029 Acute pharyngitis, unspecified: Secondary | ICD-10-CM

## 2014-07-17 LAB — RAPID STREP SCREEN (MED CTR MEBANE ONLY): STREPTOCOCCUS, GROUP A SCREEN (DIRECT): NEGATIVE

## 2014-07-17 MED ORDER — LIDOCAINE VISCOUS 2 % MT SOLN: 15.0000 mL | OROMUCOSAL | Status: DC | PRN

## 2014-07-17 MED ORDER — DEXAMETHASONE 6 MG PO TABS
10.0000 mg | ORAL_TABLET | Freq: Once | ORAL | Status: AC
  Administered 2014-07-17: 10 mg via ORAL
  Filled 2014-07-17: qty 1

## 2014-07-17 MED ORDER — PSEUDOEPHEDRINE HCL 30 MG PO TABS: 30.0000 mg | ORAL_TABLET | Freq: Two times a day (BID) | ORAL | Status: AC

## 2014-07-17 MED ORDER — KETOROLAC TROMETHAMINE 60 MG/2ML IM SOLN
30.0000 mg | Freq: Once | INTRAMUSCULAR | Status: AC
  Administered 2014-07-17: 30 mg via INTRAMUSCULAR
  Filled 2014-07-17: qty 2

## 2014-07-17 MED ORDER — LIDOCAINE VISCOUS 2 % MT SOLN
15.0000 mL | Freq: Once | OROMUCOSAL | Status: AC
  Administered 2014-07-17: 15 mL via OROMUCOSAL
  Filled 2014-07-17: qty 15

## 2014-07-17 MED ORDER — PSEUDOEPHEDRINE HCL 60 MG PO TABS
30.0000 mg | ORAL_TABLET | Freq: Once | ORAL | Status: AC
  Administered 2014-07-17: 30 mg via ORAL
  Filled 2014-07-17: qty 1

## 2014-07-17 NOTE — ED Notes (Signed)
Pt c/o sore throat, nasal congestion, chills, and generalized bodyaches x 1 day.  Pain score 10/10.  Pt reports taking OTC medication w/o relief.

## 2014-07-17 NOTE — ED Provider Notes (Signed)
CSN: [id number]     Arrival date & time 07/17/14  0809 History   First MD Initiated Contact with Patient 07/17/14 [phone]     Chief Complaint  Patient presents with  . Sore Throat  . Generalized Body Aches     (Consider location/radiation/quality/duration/timing/severity/associated sxs/prior Treatment) HPI Patient presents with one day of illness. Symptoms began approximately 26 hours ago, upon awakening.  The night prior the patient was in her usual state of health. She awoke with sore throat, nasal congestion, body, but no fever. The patient has had no relief with OTC medication. She states that she is generally well, takes no medication regularly. Patient does not smoke. Since onset she has had discomfort mostly in the throat, as well as generalized body ache. She denies specific chest pain, dyspnea, cough.  She denies dysphagia, dysphasia.   History reviewed. No pertinent past medical history. History reviewed. No pertinent past surgical history. History reviewed. No pertinent family history. History  Substance Use Topics  . Smoking status: Former Smoker    Types: Cigars    Quit date: 08/13/2012  . Smokeless tobacco: Never Used  . Alcohol Use: Yes     Comment: occasional    OB History    Gravida Para Term Preterm AB TAB SAB Ectopic Multiple Living   Review of Systems  Constitutional:       Per HPI, otherwise negative  HENT:       Per HPI, otherwise negative  Respiratory:       Per HPI, otherwise negative  Cardiovascular:       Per HPI, otherwise negative  Gastrointestinal: Negative for vomiting.  Endocrine:       Negative aside from HPI  Genitourinary:       Neg aside from HPI   Musculoskeletal:       Per HPI, otherwise negative  Skin: Negative.   Neurological: Negative for syncope.      Allergies  Review of patient's allergies indicates no known allergies.  Home Medications   Prior to Admission medications   Medication Sig Start  Date End Date Taking? Authorizing Provider  aspirin-acetaminophen-caffeine (EXCEDRIN MIGRAINE) [phone] MG per tablet Take 4 tablets by mouth every 6 (six) hours as needed for headache.    Historical Provider, MD  ibuprofen (ADVIL,MOTRIN) 200 MG tablet Take 800 mg by mouth every 6 (six) hours as needed (pain.).    Historical Provider, MD  lidocaine (XYLOCAINE) 2 % solution Use as directed 15 mLs in the mouth or throat every 4 (four) hours as needed for mouth pain. 07/17/14   Gerhard Munch, MD  pseudoephedrine (SUDAFED) 30 MG tablet Take 1 tablet (30 mg total) by mouth 2 (two) times daily. 07/17/14 07/22/14  Gerhard Munch, MD   BP 107/66 mmHg  Pulse 107  Temp(Src) 98.7 F (37.1 C) (Oral)  Resp 16  SpO2 98%  LMP 06/29/2014 (Exact Date) Physical Exam  Constitutional: She is oriented to person, place, and time. She appears well-developed and well-nourished. No distress.  HENT:  Head: Normocephalic and atraumatic.  Right Ear: Tympanic membrane normal.  Left Ear: Tympanic membrane normal.  Mouth/Throat: Uvula is midline, oropharynx is clear and moist and mucous membranes are normal.    Eyes: Conjunctivae and EOM are normal.  Cardiovascular: Normal rate and regular rhythm.   Pulmonary/Chest: Effort normal and breath sounds normal. No stridor. No respiratory distress.  Abdominal: She exhibits no distension.  Musculoskeletal: She  exhibits no edema.  Lymphadenopathy:    She has cervical adenopathy.       Right cervical: Superficial cervical adenopathy present.       Left cervical: Superficial cervical adenopathy present.  Neurological: She is alert and oriented to person, place, and time. No cranial nerve deficit.  Skin: Skin is warm and dry.  Psychiatric: She has a normal mood and affect.  Nursing note and vitals reviewed.   ED Course  Procedures (including critical care time) Labs Review Labs Reviewed  RAPID STREP SCREEN  CULTURE, GROUP A STREP    Initial strep test  negative.  MDM   Final diagnoses:  Pharyngitis    Patient presents with approximately 26 hours of generalized discomfort, sore throat, sinus congestion. Patient has negative strep test, and absent fever, there is low suspicion for influenza as well. In addition, the patient is more than one day out since the onset of symptoms, making her not a likely recipient of substantial benefit for Tamiflu. Patient was started on Sudafed, topical analgesia for her sore throat, provided resources for primary care follow-up, discharged in stable condition.    Gerhard Munchobert Lafern Brinkley, MD 07/17/14 0900

## 2014-07-17 NOTE — Discharge Instructions (Signed)
As discussed, your evaluation today has been largely reassuring.  But, it is important that you monitor your condition carefully, and do not hesitate to return to the ED if you develop new, or concerning changes in your condition.  Your symptoms are likely due to a viral infection.  It is important that you stay well-hydrated, get plenty of rest, and take all medication as directed.

## 2014-07-18 LAB — CULTURE, GROUP A STREP

## 2014-09-05 ENCOUNTER — Ambulatory Visit (INDEPENDENT_AMBULATORY_CARE_PROVIDER_SITE_OTHER): Payer: No Typology Code available for payment source | Admitting: Family Medicine

## 2014-09-05 VITALS — BP 122/84 | HR 76 | Temp 98.3°F | Resp 16 | Ht 64.25 in | Wt 202.0 lb

## 2014-09-05 DIAGNOSIS — G4700 Insomnia, unspecified: Secondary | ICD-10-CM

## 2014-09-05 DIAGNOSIS — R1010 Upper abdominal pain, unspecified: Secondary | ICD-10-CM | POA: Diagnosis not present

## 2014-09-05 DIAGNOSIS — F411 Generalized anxiety disorder: Secondary | ICD-10-CM

## 2014-09-05 DIAGNOSIS — N939 Abnormal uterine and vaginal bleeding, unspecified: Secondary | ICD-10-CM

## 2014-09-05 LAB — CBC
HCT: 39.3 % (ref 36.0–46.0)
Hemoglobin: 13.5 g/dL (ref 12.0–15.0)
MCH: 30 pg (ref 26.0–34.0)
MCHC: 34.4 g/dL (ref 30.0–36.0)
MCV: 87.3 fL (ref 78.0–100.0)
MPV: 10.1 fL (ref 8.6–12.4)
Platelets: 325 10*3/uL (ref 150–400)
RBC: 4.5 MIL/uL (ref 3.87–5.11)
RDW: 12.7 % (ref 11.5–15.5)
WBC: 6.3 10*3/uL (ref 4.0–10.5)

## 2014-09-05 LAB — TSH: TSH: 0.858 u[IU]/mL (ref 0.350–4.500)

## 2014-09-05 MED ORDER — CLONAZEPAM 0.5 MG PO TABS: 0.5000 mg | ORAL_TABLET | Freq: Every evening | ORAL | Status: AC | PRN

## 2014-09-05 MED ORDER — VENLAFAXINE HCL ER 37.5 MG PO CP24: ORAL_CAPSULE | ORAL | Status: AC

## 2014-09-05 NOTE — Progress Notes (Signed)
Subjective:    Patient ID: Courtney Salinas, female    DOB: 1990-12-18, 24 y.o.   MRN: [id number]  HPI This is a pleasant 24 yo female who presents today with feelings of nausea, knot in stomach, decreased appetite, headaches and heart burn. Symptoms have been going on for about 3 months and have gotten progressively worse over the last 2 weeks. The pateient admits to not eating well, to skipping meals then making bad choices and overeating.   The patient has not had PCP, she has been getting episodic care through the ER. She is interested in establishing care.   Has a history of insomnia and takes melatonin. Seemed to help at first but is not helping now. She has difficulty going to sleep. Once she is asleep, is usually able to sleep through the night and feels rested.   She has a 90 year old daughter and is a single mom. She is currently under a great deal of stress due to her job and her finances. She has no support from her child's father and little family support. She struggles to find affordable child care and is having problems with her car. She reports feeling overwhelmed and stressed out for at least a year, maybe longer.   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   Had a very heavy period earlier this month, lasted 7 days and discharge full of clots. She is not currently sexually active.   No past medical history on file. No past surgical history on file. No family history on file. History  Substance Use Topics  . Smoking status: Current Some Day Smoker    Types: Cigars    Last Attempt to Quit: 08/13/2012  . Smokeless tobacco: Never Used  . Alcohol Use: 0.0 oz/week    0 Standard drinks or equivalent per week     Comment: occasional      Review of Systems Occasional vomiting, no weight loss, no diarrhea or constipation, no dark stools or blood in stools.     Objective:   Physical  Exam  Constitutional: She is oriented to person, place, and time. She appears well-developed and well-nourished.  HENT:  Head: Normocephalic and atraumatic.  Right Ear: External ear normal.  Left Ear: External ear normal.  Nose: Nose normal.  Mouth/Throat: Oropharynx is clear and moist. No oropharyngeal exudate.  Eyes: Conjunctivae are normal.  Neck: Normal range of motion. Neck supple.  Cardiovascular: Normal rate and normal heart sounds.   Pulmonary/Chest: Effort normal and breath sounds normal.  Abdominal: Soft. Bowel sounds are normal. She exhibits no distension and no mass. There is no hepatosplenomegaly. There is tenderness in the epigastric area. There is no rebound, no guarding and no CVA tenderness.  Musculoskeletal: Normal range of motion.  Neurological: She is alert and oriented to person, place, and time.  Skin: Skin is warm and dry.  Psychiatric: She has a normal mood and affect. Her behavior is normal. Judgment and thought content normal.  Vitals reviewed. BP 122/84 mmHg  Pulse 76  Temp(Src) 98.3 F (36.8 C) (Oral)  Resp 16  Ht 5' 4.25" (1.632 m)  Wt 202 lb (91.627 kg)  BMI 34.40 kg/m2  SpO2 100%  LMP 08/25/2014     Assessment & Plan:  1. Abnormal uterine bleeding - TSH - CBC  2. Pain of upper abdomen - Trial of H2 blocker  BID for at least 2 weeks, after 2 weeks, can continue or use PRN - encouraged regular meals and snacks, avoiding overeating/trigger foods   3. Generalized anxiety disorder - encouraged regular exercise/meditation, utilizing support systems,  - venlafaxine XR (EFFEXOR XR) 37.5 MG 24 hr capsule; Take 1 capsule by mouth with breakfast for 1 week then increase to 2 capsules daily.  Dispense: 60 capsule; Refill: 1 - clonazePAM (KLONOPIN) 0.5 MG tablet; Take 1 tablet (0.5 mg total) by mouth at bedtime as needed for anxiety/sleep.  Dispense: 20 tablet; Refill: 0  4. Insomnia - continue melatonin - encouraged decreased screen time 1 hour prior  to bedtime, meditation/relaxation - clonazePAM (KLONOPIN) 0.5 MG tablet; Take 1 tablet (0.5 mg total) by mouth at bedtime as needed for anxiety/sleep.  Dispense: 20 tablet; Refill: 0  - follow up in 4-6 weeks, sooner if worsening symptoms or no improvement.   Emi Belfasteborah B. Gessner, FNP-BC  [facility]  09/07/2014 9:57 PM

## 2014-09-05 NOTE — Patient Instructions (Signed)
Please take Pepcid AC (generic fine) twice a day for at least 2 weeks for your stomach, can continue if needed or use on as needed basis.   Gastroesophageal Reflux Disease, Adult Gastroesophageal reflux disease (GERD) happens when acid from your stomach flows up into the esophagus. When acid comes in contact with the esophagus, the acid causes soreness (inflammation) in the esophagus. Over time, GERD may create small holes (ulcers) in the lining of the esophagus. CAUSES   Increased body weight. This puts pressure on the stomach, making acid rise from the stomach into the esophagus.  Smoking. This increases acid production in the stomach.  Drinking alcohol. This causes decreased pressure in the lower esophageal sphincter (valve or ring of muscle between the esophagus and stomach), allowing acid from the stomach into the esophagus.  Late evening meals and a full stomach. This increases pressure and acid production in the stomach.  A malformed lower esophageal sphincter. Sometimes, no cause is found. SYMPTOMS   Burning pain in the lower part of the mid-chest behind the breastbone and in the mid-stomach area. This may occur twice a week or more often.  Trouble swallowing.  Sore throat.  Dry cough.  Asthma-like symptoms including chest tightness, shortness of breath, or wheezing. DIAGNOSIS  Your caregiver may be able to diagnose GERD based on your symptoms. In some cases, X-rays and other tests may be done to check for complications or to check the condition of your stomach and esophagus. TREATMENT  Your caregiver may recommend over-the-counter or prescription medicines to help decrease acid production. Ask your caregiver before starting or adding any new medicines.  HOME CARE INSTRUCTIONS   Change the factors that you can control. Ask your caregiver for guidance concerning weight loss, quitting smoking, and alcohol consumption.  Avoid foods and drinks that make your symptoms worse,  such as:  Caffeine or alcoholic drinks.  Chocolate.  Peppermint or mint flavorings.  Garlic and onions.  Spicy foods.  Citrus fruits, such as oranges, lemons, or limes.  Tomato-based foods such as sauce, chili, salsa, and pizza.  Fried and fatty foods.  Avoid lying down for the 3 hours prior to your bedtime or prior to taking a nap.  Eat small, frequent meals instead of large meals.  Wear loose-fitting clothing. Do not wear anything tight around your waist that causes pressure on your stomach.  Raise the head of your bed 6 to 8 inches with wood blocks to help you sleep. Extra pillows will not help.  Only take over-the-counter or prescription medicines for pain, discomfort, or fever as directed by your caregiver.  Do not take aspirin, ibuprofen, or other nonsteroidal anti-inflammatory drugs (NSAIDs). SEEK IMMEDIATE MEDICAL CARE IF:   You have pain in your arms, neck, jaw, teeth, or back.  Your pain increases or changes in intensity or duration.  You develop nausea, vomiting, or sweating (diaphoresis).  You develop shortness of breath, or you faint.  Your vomit is green, yellow, black, or looks like coffee grounds or blood.  Your stool is red, bloody, or black. These symptoms could be signs of other problems, such as heart disease, gastric bleeding, or esophageal bleeding. MAKE SURE YOU:   Understand these instructions.  Will watch your condition.  Will get help right away if you are not doing well or get worse. Document Released: 03/17/2005 Document Revised: 08/30/2011 Document Reviewed: 12/25/2010 [facility] Patient Information 2015 [employer] [place]. This information is not intended to replace advice given to you by your health care provider.  Make sure you discuss any questions you have with your health care provider. Venlafaxine extended-release capsules What is this medicine? VENLAFAXINE(VEN la fax een) is used to treat depression, anxiety and panic  disorder. This medicine may be used for other purposes; ask your health care provider or pharmacist if you have questions. COMMON BRAND NAME(S): Effexor XR What should I tell my health care provider before I take this medicine? They need to know if you have any of these conditions: -bleeding disorders -glaucoma -heart disease -high blood pressure -high cholesterol -kidney disease -liver disease -low levels of sodium in the blood -mania or bipolar disorder -seizures -suicidal thoughts, plans, or attempt; a previous suicide attempt by you or a family -take medicines that treat or prevent blood clots -thyroid disease -an unusual or allergic reaction to venlafaxine, desvenlafaxine, other medicines, foods, dyes, or preservatives -pregnant or trying to get pregnant -breast-feeding How should I use this medicine? Take this medicine by mouth with a full glass of water. Follow the directions on the prescription label. Do not cut, crush, or chew this medicine. Take it with food. If needed, the capsule may be carefully opened and the entire contents sprinkled on a spoonful of cool applesauce. Swallow the applesauce/pellet mixture right away without chewing and follow with a glass of water to ensure complete swallowing of the pellets. Try to take your medicine at about the same time each day. Do not take your medicine more often than directed. Do not stop taking this medicine suddenly except upon the advice of your doctor. Stopping this medicine too quickly may cause serious side effects or your condition may worsen. A special MedGuide will be given to you by the pharmacist with each prescription and refill. Be sure to read this information carefully each time. Talk to your pediatrician regarding the use of this medicine in children. Special care may be needed. Overdosage: If you think you have taken too much of this medicine contact a poison control center or emergency room at once. NOTE: This  medicine is only for you. Do not share this medicine with others. What if I miss a dose? If you miss a dose, take it as soon as you can. If it is almost time for your next dose, take only that dose. Do not take double or extra doses. What may interact with this medicine? Do not take this medicine with any of the following medications: -certain medicines for fungal infections like fluconazole, itraconazole, ketoconazole, posaconazole, voriconazole -cisapride -desvenlafaxine -dofetilide -dronedarone -duloxetine -levomilnacipran -linezolid -MAOIs like Carbex, Eldepryl, Marplan, Nardil, and Parnate -methylene blue (injected into a vein) -milnacipran -pimozide -thioridazine -ziprasidone This medicine may also interact with the following medications: -aspirin and aspirin-like medicines -certain medicines for depression, anxiety, or psychotic disturbances -certain medicines for migraine headaches like almotriptan, eletriptan, frovatriptan, naratriptan, rizatriptan, sumatriptan, zolmitriptan -certain medicines for sleep -certain medicines that treat or prevent blood clots like dalteparin, enoxaparin, warfarin -cimetidine -clozapine -diuretics -fentanyl -furazolidone -indinavir -isoniazid -lithium -metoprolol -NSAIDS, medicines for pain and inflammation, like ibuprofen or naproxen -other medicines that prolong the QT interval (cause an abnormal heart rhythm) -procarbazine -rasagiline -supplements like St. John's wort, kava kava, valerian -tramadol -tryptophan This list may not describe all possible interactions. Give your health care provider a list of all the medicines, herbs, non-prescription drugs, or dietary supplements you use. Also tell them if you smoke, drink alcohol, or use illegal drugs. Some items may interact with your medicine. What should I watch for while using this medicine? Tell your  doctor if your symptoms do not get better or if they get worse. Visit your doctor  or health care professional for regular checks on your progress. Because it may take several weeks to see the full effects of this medicine, it is important to continue your treatment as prescribed by your doctor. Patients and their families should watch out for new or worsening thoughts of suicide or depression. Also watch out for sudden changes in feelings such as feeling anxious, agitated, panicky, irritable, hostile, aggressive, impulsive, severely restless, overly excited and hyperactive, or not being able to sleep. If this happens, especially at the beginning of treatment or after a change in dose, call your health care professional. This medicine can cause an increase in blood pressure. Check with your doctor for instructions on monitoring your blood pressure while taking this medicine. You may get drowsy or dizzy. Do not drive, use machinery, or do anything that needs mental alertness until you know how this medicine affects you. Do not stand or sit up quickly, especially if you are an older patient. This reduces the risk of dizzy or fainting spells. Alcohol may interfere with the effect of this medicine. Avoid alcoholic drinks. Your mouth may get dry. Chewing sugarless gum, sucking hard candy and drinking plenty of water will help. Contact your doctor if the problem does not go away or is severe. What side effects may I notice from receiving this medicine? Side effects that you should report to your doctor or health care professional as soon as possible: -allergic reactions like skin rash, itching or hives, swelling of the face, lips, or tongue -breathing problems -changes in vision -hallucination, loss of contact with reality -seizures -suicidal thoughts or other mood changes -trouble passing urine or change in the amount of urine -unusual bleeding or bruising Side effects that usually do not require medical attention (report to your doctor or health care professional if they continue or are  bothersome): -change in sex drive or performance -constipation -increased sweating -loss of appetite -nausea -tremors -weight loss This list may not describe all possible side effects. Call your doctor for medical advice about side effects. You may report side effects to FDA at 1-800-FDA-1088. Where should I keep my medicine? Keep out of the reach of children. Store at a controlled temperature between 20 and 25 degrees C (68 degrees and 77 degrees F), in a dry place. Throw away any unused medicine after the expiration date. NOTE: This sheet is a summary. It may not cover all possible information. If you have questions about this medicine, talk to your doctor, pharmacist, or health care provider.  2015, [employer]. (2013-01-02 12:46:03)

## 2014-10-15 ENCOUNTER — Ambulatory Visit (INDEPENDENT_AMBULATORY_CARE_PROVIDER_SITE_OTHER): Payer: No Typology Code available for payment source | Admitting: Emergency Medicine

## 2014-10-15 VITALS — BP 116/68 | HR 74 | Temp 98.0°F | Resp 17 | Ht 64.0 in | Wt 201.0 lb

## 2014-10-15 DIAGNOSIS — R112 Nausea with vomiting, unspecified: Secondary | ICD-10-CM | POA: Diagnosis not present

## 2014-10-15 DIAGNOSIS — G43009 Migraine without aura, not intractable, without status migrainosus: Secondary | ICD-10-CM | POA: Diagnosis not present

## 2014-10-15 DIAGNOSIS — N3000 Acute cystitis without hematuria: Secondary | ICD-10-CM

## 2014-10-15 LAB — POCT UA - MICROSCOPIC ONLY
CASTS, UR, LPF, POC: NEGATIVE
Crystals, Ur, HPF, POC: NEGATIVE
Mucus, UA: POSITIVE
YEAST UA: NEGATIVE

## 2014-10-15 LAB — COMPREHENSIVE METABOLIC PANEL
ALBUMIN: 3.9 g/dL (ref 3.5–5.2)
ALK PHOS: 50 U/L (ref 39–117)
ALT: 9 U/L (ref 0–35)
AST: 14 U/L (ref 0–37)
BUN: 11 mg/dL (ref 6–23)
CO2: 25 mEq/L (ref 19–32)
Calcium: 9.3 mg/dL (ref 8.4–10.5)
Chloride: 105 mEq/L (ref 96–112)
Creat: 0.73 mg/dL (ref 0.50–1.10)
Glucose, Bld: 85 mg/dL (ref 70–99)
POTASSIUM: 4.2 meq/L (ref 3.5–5.3)
Sodium: 140 mEq/L (ref 135–145)
TOTAL PROTEIN: 7.1 g/dL (ref 6.0–8.3)
Total Bilirubin: 0.2 mg/dL (ref 0.2–1.2)

## 2014-10-15 LAB — POCT URINALYSIS DIPSTICK
Bilirubin, UA: NEGATIVE
Glucose, UA: NEGATIVE
KETONES UA: NEGATIVE
Nitrite, UA: NEGATIVE
Protein, UA: 30
Spec Grav, UA: >=1.03
UROBILINOGEN UA: 0.2
pH, UA: 6.5

## 2014-10-15 LAB — POCT CBC
GRANULOCYTE PERCENT: 61.9 % (ref 37–80)
HCT, POC: 39.3 % (ref 37.7–47.9)
HEMOGLOBIN: 12.8 g/dL (ref 12.2–16.2)
Lymph, poc: 2.4 (ref 0.6–3.4)
MCH, POC: 28.8 pg (ref 27–31.2)
MCHC: 32.6 g/dL (ref 31.8–35.4)
MCV: 88.3 fL (ref 80–97)
MID (cbc): 0.2 (ref 0–0.9)
MPV: 7.8 fL (ref 0–99.8)
PLATELET COUNT, POC: 314 10*3/uL (ref 142–424)
POC GRANULOCYTE: 4.2 (ref 2–6.9)
POC LYMPH PERCENT: 35.4 %L (ref 10–50)
POC MID %: 2.7 %M (ref 0–12)
RBC: 4.45 M/uL (ref 4.04–5.48)
RDW, POC: 13.9 %
WBC: 6.8 10*3/uL (ref 4.6–10.2)

## 2014-10-15 LAB — LIPASE: Lipase: 21 U/L (ref 0–75)

## 2014-10-15 LAB — AMYLASE: Amylase: 61 U/L (ref 0–105)

## 2014-10-15 LAB — POCT URINE PREGNANCY: Preg Test, Ur: NEGATIVE

## 2014-10-15 MED ORDER — SULFAMETHOXAZOLE-TRIMETHOPRIM 800-160 MG PO TABS: 1.0000 | ORAL_TABLET | Freq: Two times a day (BID) | ORAL | Status: AC

## 2014-10-15 MED ORDER — ONDANSETRON 4 MG PO TBDP
8.0000 mg | ORAL_TABLET | Freq: Once | ORAL | Status: AC
  Administered 2014-10-15: 8 mg via ORAL

## 2014-10-15 MED ORDER — ONDANSETRON 8 MG PO TBDP: 8.0000 mg | ORAL_TABLET | Freq: Three times a day (TID) | ORAL | Status: AC | PRN

## 2014-10-15 NOTE — Patient Instructions (Signed)

## 2014-10-15 NOTE — Progress Notes (Signed)
[facility] [address], [place] [address] [phone]  Date:  10/15/2014   Name:  Courtney Salinas   DOB:  Apr 17, 1991   MRN:  [id number]  PCP:  No PCP Per Patient    Chief Complaint: Emesis; Nausea; and Headache   History of Present Illness:  Courtney Salinas is a 24 y.o. very pleasant female patient who presents with the following:  Patient has a history of severe right sided headaches over the past month Says some photophobia and nausea with headache. No antecedent illness or injury. Pain pulsates. No neuro symptoms associated with headache. No fever or chills Now over past week has experienced nausea and vomiting x3 in past 24 hours No food intolerance Frequent heartburn responds to pepcid AC No stool change, xs alcohol or NSAID.  No xs caffeine.  Non smoker. Never prior No improvement with over the counter medications or other home remedies.  Denies other complaint or health concern today.   There are no active problems to display for this patient.   History reviewed. No pertinent past medical history.  History reviewed. No pertinent past surgical history.  History  Substance Use Topics  . Smoking status: Current Some Day Smoker    Types: Cigars    Last Attempt to Quit: 08/13/2012  . Smokeless tobacco: Never Used  . Alcohol Use: 0.0 oz/week    0 Standard drinks or equivalent per week     Comment: occasional     History reviewed. No pertinent family history.  No Known Allergies  Medication list has been reviewed and updated.  Current Outpatient Prescriptions on File Prior to Visit  Medication Sig Dispense Refill  . aspirin-acetaminophen-caffeine (EXCEDRIN MIGRAINE) 250-250-65 MG per tablet Take 4 tablets by mouth every 6 (six) hours as needed for headache.    . venlafaxine XR (EFFEXOR XR) 37.5 MG 24 hr capsule Take 1 capsule by mouth with breakfast for 1 week then increase to 2 capsules daily. 60 capsule 1  . clonazePAM  (KLONOPIN) 0.5 MG tablet Take 1 tablet (0.5 mg total) by mouth at bedtime as needed for anxiety. (Patient not taking: Reported on 10/15/2014) 20 tablet 0  . ibuprofen (ADVIL,MOTRIN) 200 MG tablet Take 800 mg by mouth every 6 (six) hours as needed (pain.).     No current facility-administered medications on file prior to visit.    Review of Systems:  As per HPI, otherwise negative.    Physical Examination: Filed Vitals:   10/15/14 1347  BP: 116/68  Pulse: 74  Temp: 98 F (36.7 C)  Resp: 17   Filed Vitals:   10/15/14 1347  Height: 5\' 4"  (1.626 m)  Weight: 201 lb (91.173 kg)   Body mass index is 34.48 kg/(m^2). Ideal Body Weight: Weight in (lb) to have BMI = 25: 145.3  GEN: obese, NAD, Non-toxic, A & O x 3 HEENT: Atraumatic, Normocephalic. Neck supple. No masses, No LAD. Ears and Nose: No external deformity. CV: RRR, No M/G/R. No JVD. No thrill. No extra heart sounds. PULM: CTA B, no wheezes, crackles, rhonchi. No retractions. No resp. distress. No accessory muscle use. ABD: S, NT, ND, +BS. No rebound. No HSM. EXTR: No c/c/e NEURO Normal gait.  PSYCH: Normally interactive. Conversant. Not depressed or anxious appearing.  Calm demeanor.    Assessment and Plan: Acute cystitis Septra zofran  Signed,  Phillips OdorJeffery Ilayda Toda, MD   Results for orders placed or performed in visit on 10/15/14  POCT urine pregnancy  Result  Value Ref Range   Preg Test, Ur Negative   POCT urinalysis dipstick  Result Value Ref Range   Color, UA yellow    Clarity, UA slightly cloudy    Glucose, UA neg    Bilirubin, UA neg    Ketones, UA neg    Spec Grav, UA >=1.030    Blood, UA trace-intact    pH, UA 6.5    Protein, UA 30    Urobilinogen, UA 0.2    Nitrite, UA neg    Leukocytes, UA Trace   POCT UA - Microscopic Only  Result Value Ref Range   WBC, Ur, HPF, POC 7-10    RBC, urine, microscopic 1-3    Bacteria, U Microscopic 3+    Mucus, UA pos    Epithelial cells, urine per micros 5-7     Crystals, Ur, HPF, POC neg    Casts, Ur, LPF, POC neg    Yeast, UA neg   POCT CBC  Result Value Ref Range   WBC 6.8 4.6 - 10.2 K/uL   Lymph, poc 2.4 0.6 - 3.4   POC LYMPH PERCENT 35.4 10 - 50 %L   MID (cbc) 0.2 0 - 0.9   POC MID % 2.7 0 - 12 %M   POC Granulocyte 4.2 2 - 6.9   Granulocyte percent 61.9 37 - 80 %G   RBC 4.45 4.04 - 5.48 M/uL   Hemoglobin 12.8 12.2 - 16.2 g/dL   HCT, POC 96.0 45.4 - 47.9 %   MCV 88.3 80 - 97 fL   MCH, POC 28.8 27 - 31.2 pg   MCHC 32.6 31.8 - 35.4 g/dL   RDW, POC 09.8 %   Platelet Count, POC 314 142 - 424 K/uL   MPV 7.8 0 - 99.8 fL

## 2014-10-17 ENCOUNTER — Encounter (HOSPITAL_COMMUNITY): Payer: Self-pay

## 2014-10-17 ENCOUNTER — Encounter: Payer: Self-pay | Admitting: Family Medicine

## 2014-10-17 ENCOUNTER — Emergency Department (HOSPITAL_COMMUNITY)
Admission: EM | Admit: 2014-10-17 | Discharge: 2014-10-17 | Disposition: A | Discharge status: Home / Self Care | Payer: Self-pay | Attending: Emergency Medicine | Admitting: Emergency Medicine

## 2014-10-17 DIAGNOSIS — R109 Unspecified abdominal pain: Secondary | ICD-10-CM

## 2014-10-17 DIAGNOSIS — J029 Acute pharyngitis, unspecified: Secondary | ICD-10-CM | POA: Insufficient documentation

## 2014-10-17 DIAGNOSIS — R42 Dizziness and giddiness: Secondary | ICD-10-CM | POA: Insufficient documentation

## 2014-10-17 DIAGNOSIS — R1032 Left lower quadrant pain: Secondary | ICD-10-CM | POA: Insufficient documentation

## 2014-10-17 DIAGNOSIS — R112 Nausea with vomiting, unspecified: Secondary | ICD-10-CM | POA: Insufficient documentation

## 2014-10-17 DIAGNOSIS — Z720 Tobacco use: Secondary | ICD-10-CM | POA: Insufficient documentation

## 2014-10-17 DIAGNOSIS — Z79899 Other long term (current) drug therapy: Secondary | ICD-10-CM | POA: Insufficient documentation

## 2014-10-17 LAB — URINALYSIS, ROUTINE W REFLEX MICROSCOPIC
Bilirubin Urine: NEGATIVE
GLUCOSE, UA: NEGATIVE mg/dL
HGB URINE DIPSTICK: NEGATIVE
Ketones, ur: NEGATIVE mg/dL
LEUKOCYTES UA: NEGATIVE
Nitrite: NEGATIVE
PROTEIN: NEGATIVE mg/dL
Specific Gravity, Urine: 1.017 (ref 1.005–1.030)
Urobilinogen, UA: 0.2 mg/dL (ref 0.0–1.0)
pH: 7.5 (ref 5.0–8.0)

## 2014-10-17 LAB — CBC WITH DIFFERENTIAL/PLATELET
BASOS ABS: 0 10*3/uL (ref 0.0–0.1)
Basophils Relative: 1 % (ref 0–1)
Eosinophils Absolute: 0.2 10*3/uL (ref 0.0–0.7)
Eosinophils Relative: 2 % (ref 0–5)
HCT: 39.1 % (ref 36.0–46.0)
HEMOGLOBIN: 13.1 g/dL (ref 12.0–15.0)
LYMPHS PCT: 32 % (ref 12–46)
Lymphs Abs: 2.3 10*3/uL (ref 0.7–4.0)
MCH: 29.7 pg (ref 26.0–34.0)
MCHC: 33.5 g/dL (ref 30.0–36.0)
MCV: 88.7 fL (ref 78.0–100.0)
Monocytes Absolute: 0.6 10*3/uL (ref 0.1–1.0)
Monocytes Relative: 9 % (ref 3–12)
NEUTROS ABS: 4 10*3/uL (ref 1.7–7.7)
NEUTROS PCT: 56 % (ref 43–77)
Platelets: 301 10*3/uL (ref 150–400)
RBC: 4.41 MIL/uL (ref 3.87–5.11)
RDW: 12.4 % (ref 11.5–15.5)
WBC: 7.1 10*3/uL (ref 4.0–10.5)

## 2014-10-17 LAB — COMPREHENSIVE METABOLIC PANEL
ALBUMIN: 4 g/dL (ref 3.5–5.2)
ALK PHOS: 51 U/L (ref 39–117)
ALT: 16 U/L (ref 0–35)
AST: 27 U/L (ref 0–37)
Anion gap: 4 — ABNORMAL LOW (ref 5–15)
BUN: 12 mg/dL (ref 6–23)
CHLORIDE: 107 mmol/L (ref 96–112)
CO2: 25 mmol/L (ref 19–32)
Calcium: 9.5 mg/dL (ref 8.4–10.5)
Creatinine, Ser: 1.09 mg/dL (ref 0.50–1.10)
GFR, EST AFRICAN AMERICAN: 81 mL/min — AB (ref >90)
GFR, EST NON AFRICAN AMERICAN: 70 mL/min — AB (ref >90)
Glucose, Bld: 91 mg/dL (ref 70–99)
POTASSIUM: 4.4 mmol/L (ref 3.5–5.1)
Sodium: 136 mmol/L (ref 135–145)
Total Bilirubin: 0.4 mg/dL (ref 0.3–1.2)
Total Protein: 7.2 g/dL (ref 6.0–8.3)

## 2014-10-17 LAB — RAPID STREP SCREEN (MED CTR MEBANE ONLY): STREPTOCOCCUS, GROUP A SCREEN (DIRECT): NEGATIVE

## 2014-10-17 LAB — LIPASE, BLOOD: Lipase: 27 U/L (ref 11–59)

## 2014-10-17 MED ORDER — SODIUM CHLORIDE 0.9 % IV BOLUS (SEPSIS)
1000.0000 mL | Freq: Once | INTRAVENOUS | Status: AC
  Administered 2014-10-17: 1000 mL via INTRAVENOUS

## 2014-10-17 MED ORDER — ONDANSETRON HCL 4 MG/2ML IJ SOLN
4.0000 mg | Freq: Once | INTRAMUSCULAR | Status: AC
  Administered 2014-10-17: 4 mg via INTRAVENOUS
  Filled 2014-10-17: qty 2

## 2014-10-17 NOTE — ED Notes (Signed)
Per pt, has had vomiting x 2 weeks off/on.  Was seen 2 days ago for cystitis.  Pt given antibiotics.  Pt today had a vomiting episode x 1 that had blood in it.  Pt also states left lower quad pain. When questioned if pain was the same prior to dx of cystitis, pt denied being seen for abdominal pain and did not know at the time that she had infection. Was at family medicine for feeling nauseated/vomiting and light headed.

## 2014-10-17 NOTE — Discharge Instructions (Signed)
Abdominal Pain, Women °Abdominal (stomach, pelvic, or belly) pain can be caused by many things. It is important to tell your doctor: °· The location of the pain. °· Does it come and go or is it present all the time? °· Are there things that start the pain (eating certain foods, exercise)? °· Are there other symptoms associated with the pain (fever, nausea, vomiting, diarrhea)? °All of this is helpful to know when trying to find the cause of the pain. °CAUSES  °· Stomach: virus or bacteria infection, or ulcer. °· Intestine: appendicitis (inflamed appendix), regional ileitis (Crohn's disease), ulcerative colitis (inflamed colon), irritable bowel syndrome, diverticulitis (inflamed diverticulum of the colon), or cancer of the stomach or intestine. °· Gallbladder disease or stones in the gallbladder. °· Kidney disease, kidney stones, or infection. °· Pancreas infection or cancer. °· Fibromyalgia (pain disorder). °· Diseases of the female organs: °· Uterus: fibroid (non-cancerous) tumors or infection. °· Fallopian tubes: infection or tubal pregnancy. °· Ovary: cysts or tumors. °· Pelvic adhesions (scar tissue). °· Endometriosis (uterus lining tissue growing in the pelvis and on the pelvic organs). °· Pelvic congestion syndrome (female organs filling up with blood just before the menstrual period). °· Pain with the menstrual period. °· Pain with ovulation (producing an egg). °· Pain with an IUD (intrauterine device, birth control) in the uterus. °· Cancer of the female organs. °· Functional pain (pain not caused by a disease, may improve without treatment). °· Psychological pain. °· Depression. °DIAGNOSIS  °Your doctor will decide the seriousness of your pain by doing an examination. °· Blood tests. °· X-rays. °· Ultrasound. °· CT scan (computed tomography, special type of X-ray). °· MRI (magnetic resonance imaging). °· Cultures, for infection. °· Barium enema (dye inserted in the large intestine, to better view it with  X-rays). °· Colonoscopy (looking in intestine with a lighted tube). °· Laparoscopy (minor surgery, looking in abdomen with a lighted tube). °· Major abdominal exploratory surgery (looking in abdomen with a large incision). °TREATMENT  °The treatment will depend on the cause of the pain.  °· Many cases can be observed and treated at home. °· Over-the-counter medicines recommended by your caregiver. °· Prescription medicine. °· Antibiotics, for infection. °· Birth control pills, for painful periods or for ovulation pain. °· Hormone treatment, for endometriosis. °· Nerve blocking injections. °· Physical therapy. °· Antidepressants. °· Counseling with a psychologist or psychiatrist. °· Minor or major surgery. °HOME CARE INSTRUCTIONS  °· Do not take laxatives, unless directed by your caregiver. °· Take over-the-counter pain medicine only if ordered by your caregiver. Do not take aspirin because it can cause an upset stomach or bleeding. °· Try a clear liquid diet (broth or water) as ordered by your caregiver. Slowly move to a bland diet, as tolerated, if the pain is related to the stomach or intestine. °· Have a thermometer and take your temperature several times a day, and record it. °· Bed rest and sleep, if it helps the pain. °· Avoid sexual intercourse, if it causes pain. °· Avoid stressful situations. °· Keep your follow-up appointments and tests, as your caregiver orders. °· If the pain does not go away with medicine or surgery, you may try: °· Acupuncture. °· Relaxation exercises (yoga, meditation). °· Group therapy. °· Counseling. °SEEK MEDICAL CARE IF:  °· You notice certain foods cause stomach pain. °· Your home care treatment is not helping your pain. °· You need stronger pain medicine. °· You want your IUD removed. °· You feel faint or   lightheaded. °· You develop nausea and vomiting. °· You develop a rash. °· You are having side effects or an allergy to your medicine. °SEEK IMMEDIATE MEDICAL CARE IF:  °· Your  pain does not go away or gets worse. °· You have a fever. °· Your pain is felt only in portions of the abdomen. The right side could possibly be appendicitis. The left lower portion of the abdomen could be colitis or diverticulitis. °· You are passing blood in your stools (bright red or black tarry stools, with or without vomiting). °· You have blood in your urine. °· You develop chills, with or without a fever. °· You pass out. °MAKE SURE YOU:  °· Understand these instructions. °· Will watch your condition. °· Will get help right away if you are not doing well or get worse. °Document Released: 04/04/2007 Document Revised: 10/22/2013 Document Reviewed: 04/24/2009 °[employer]® Patient Information ©2015 [employer]. This information is not intended to replace advice given to you by your health care provider. Make sure you discuss any questions you have with your health care provider. ° °Nausea and Vomiting °Nausea is a sick feeling that often comes before throwing up (vomiting). Vomiting is a reflex where stomach contents come out of your mouth. Vomiting can cause severe loss of body fluids (dehydration). Children and elderly adults can become dehydrated quickly, especially if they also have diarrhea. Nausea and vomiting are symptoms of a condition or disease. It is important to find the cause of your symptoms. °CAUSES  °· Direct irritation of the stomach lining. This irritation can result from increased acid production (gastroesophageal reflux disease), infection, food poisoning, taking certain medicines (such as nonsteroidal anti-inflammatory drugs), alcohol use, or tobacco use. °· Signals from the brain. These signals could be caused by a headache, heat exposure, an inner ear disturbance, increased pressure in the brain from injury, infection, a tumor, or a concussion, pain, emotional stimulus, or metabolic problems. °· An obstruction in the gastrointestinal tract (bowel obstruction). °· Illnesses such as diabetes,  hepatitis, gallbladder problems, appendicitis, kidney problems, cancer, sepsis, atypical symptoms of a heart attack, or eating disorders. °· Medical treatments such as chemotherapy and radiation. °· Receiving medicine that makes you sleep (general anesthetic) during surgery. °DIAGNOSIS °Your caregiver may ask for tests to be done if the problems do not improve after a few days. Tests may also be done if symptoms are severe or if the reason for the nausea and vomiting is not clear. Tests may include: °· Urine tests. °· Blood tests. °· Stool tests. °· Cultures (to look for evidence of infection). °· X-rays or other imaging studies. °Test results can help your caregiver make decisions about treatment or the need for additional tests. °TREATMENT °You need to stay well hydrated. Drink frequently but in small amounts. You may wish to drink water, sports drinks, clear broth, or eat frozen ice pops or gelatin dessert to help stay hydrated. When you eat, eating slowly may help prevent nausea. There are also some antinausea medicines that may help prevent nausea. °HOME CARE INSTRUCTIONS  °· Take all medicine as directed by your caregiver. °· If you do not have an appetite, do not force yourself to eat. However, you must continue to drink fluids. °· If you have an appetite, eat a normal diet unless your caregiver tells you differently. °¨ Eat a variety of complex carbohydrates (rice, wheat, potatoes, bread), lean meats, yogurt, fruits, and vegetables. °¨ Avoid high-fat foods because they are more difficult to digest. °· Drink enough water and fluids   to keep your urine clear or pale yellow. °· If you are dehydrated, ask your caregiver for specific rehydration instructions. Signs of dehydration may include: °¨ Severe thirst. °¨ Dry lips and mouth. °¨ Dizziness. °¨ Dark urine. °¨ Decreasing urine frequency and amount. °¨ Confusion. °¨ Rapid breathing or pulse. °SEEK IMMEDIATE MEDICAL CARE IF:  °· You have blood or brown flecks  (like coffee grounds) in your vomit. °· You have black or bloody stools. °· You have a severe headache or stiff neck. °· You are confused. °· You have severe abdominal pain. °· You have chest pain or trouble breathing. °· You do not urinate at least once every 8 hours. °· You develop cold or clammy skin. °· You continue to vomit for longer than 24 to 48 hours. °· You have a fever. °MAKE SURE YOU:  °· Understand these instructions. °· Will watch your condition. °· Will get help right away if you are not doing well or get worse. °Document Released: 06/07/2005 Document Revised: 08/30/2011 Document Reviewed: 11/04/2010 °[employer]® Patient Information ©2015 [employer]. This information is not intended to replace advice given to you by your health care provider. Make sure you discuss any questions you have with your health care provider. ° °

## 2014-10-17 NOTE — ED Provider Notes (Signed)
CSN: [id number]     Arrival date & time 10/17/14  4098 History   First MD Initiated Contact with Patient 10/17/14 2056     Chief Complaint  Patient presents with  . Hematemesis  . Abdominal Pain   Courtney Salinas is a 24 y.o. female who is otherwise healthy who presents to the emergency department complaining of intermittent nausea and vomiting ongoing for the past 2 weeks. Patient also reports associated epigastric and left lower quadrant abdominal pain that began today. The patient reports vomiting one time today and reports there was bright red blood streaked in her vomit. She reports she vomits almost every day for the past 2 weeks. She reports nausea earlier but none currently. Patient reports a sore throat that started today but denies trouble swallowing. She also reports intermittent positional lightheadedness. Patient is not able to identify factors that contributed to her nausea and vomiting. She reports the symptoms are sporadic and not related to eating or time of day. The patient was seen 2 days ago by her primary care provider for her 2 weeks of nausea and vomiting and intermittent lightheadedness. She was diagnosed with a UTI and started on Bactrim. She denies having any urinary symptoms. The patient denies history of abdominal surgeries. The patient denies history of diabetes or pancreatitis. The patient denies fevers, chills, trouble swallowing, urinary symptoms, hematuria, diarrhea, hematochezia, cough, chest pain, shortness of breath, palpitations, rashes. She denies current nausea.   (Consider location/radiation/quality/duration/timing/severity/associated sxs/prior Treatment) HPI  History reviewed. No pertinent past medical history. History reviewed. No pertinent past surgical history. History reviewed. No pertinent family history. History  Substance Use Topics  . Smoking status: Current Some Day Smoker    Types: Cigars    Last Attempt to Quit: 08/13/2012  . Smokeless  tobacco: Never Used  . Alcohol Use: 0.0 oz/week    0 Standard drinks or equivalent per week     Comment: occasional    OB History    Gravida Para Term Preterm AB TAB SAB Ectopic Multiple Living   Review of Systems  Constitutional: Negative for fever, chills and appetite change.  HENT: Positive for sore throat. Negative for congestion, ear pain, rhinorrhea and trouble swallowing.   Eyes: Negative for pain and visual disturbance.  Respiratory: Negative for cough, shortness of breath and wheezing.   Cardiovascular: Negative for chest pain and palpitations.  Gastrointestinal: Positive for nausea, vomiting and abdominal pain. Negative for diarrhea, constipation and blood in stool.  Genitourinary: Negative for dysuria, urgency, frequency, hematuria, flank pain, vaginal bleeding, vaginal discharge and difficulty urinating.  Musculoskeletal: Negative for back pain and neck pain.  Skin: Negative for rash.  Neurological: Positive for light-headedness. Negative for dizziness, weakness, numbness and headaches.      Allergies  Review of patient's allergies indicates no known allergies.  Home Medications   Prior to Admission medications   Medication Sig Start Date End Date Taking? Authorizing Provider  aspirin-acetaminophen-caffeine (EXCEDRIN MIGRAINE) [phone] MG per tablet Take 4 tablets by mouth every 6 (six) hours as needed for headache.   Yes Historical Provider, MD  sulfamethoxazole-trimethoprim (BACTRIM DS,SEPTRA DS) 800-160 MG per tablet Take 1 tablet by mouth 2 (two) times daily. 10/15/14  Yes Carmelina Dane, MD  venlafaxine XR (EFFEXOR XR) 37.5 MG 24 hr capsule Take 1 capsule by mouth with breakfast for 1 week then increase to 2 capsules daily. 09/05/14  Yes Emi Belfast,  FNP  clonazePAM (KLONOPIN) 0.5 MG tablet Take 1 tablet (0.5 mg total) by mouth at bedtime as needed for anxiety. Patient not taking: Reported on 10/15/2014 09/05/14   Emi Belfast, FNP   ibuprofen (ADVIL,MOTRIN) 200 MG tablet Take 800 mg by mouth every 6 (six) hours as needed (pain.).    Historical Provider, MD  ondansetron (ZOFRAN-ODT) 8 MG disintegrating tablet Take 1 tablet (8 mg total) by mouth every 8 (eight) hours as needed for nausea. 10/15/14   Carmelina Dane, MD   BP 112/71 mmHg  Pulse 79  Temp(Src) 98.3 F (36.8 C) (Oral)  Resp 18  SpO2 100%  LMP 10/03/2014 Physical Exam  Constitutional: She is oriented to person, place, and time. She appears well-developed and well-nourished. No distress.  Nontoxic appearing.  HENT:  Head: Normocephalic and atraumatic.  Right Ear: External ear normal.  Left Ear: External ear normal.  Nose: Nose normal.  Bilateral tonsillar hypertrophy with possibly a small amount of exudate. Uvula is midline without edema. Bilateral tympanic membranes are pearly-gray without erythema or loss of landmarks.  Eyes: Conjunctivae are normal. Pupils are equal, round, and reactive to light. Right eye exhibits no discharge. Left eye exhibits no discharge.  Neck: Normal range of motion. Neck supple. No JVD present.  Cardiovascular: Normal rate, regular rhythm, normal heart sounds and intact distal pulses.  Exam reveals no gallop and no friction rub.   No murmur heard. Pulmonary/Chest: Effort normal and breath sounds normal. No respiratory distress. She has no wheezes. She has no rales.  Abdominal: Soft. Bowel sounds are normal. She exhibits no distension and no mass. There is tenderness. There is no rebound and no guarding.  Abdomen is soft. Bowel sounds are present. There is a very mild left lower quadrant and epigastric tenderness. Negative Murphy's sign. No McBurney's point tenderness. Negative Rovsing sign. Negative psoas and obturator sign.  Musculoskeletal: She exhibits no edema.  Lymphadenopathy:    She has no cervical adenopathy.  Neurological: She is alert and oriented to person, place, and time. Coordination normal.  Skin: Skin is warm  and dry. No rash noted. She is not diaphoretic. No erythema. No pallor.  Psychiatric: She has a normal mood and affect. Her behavior is normal.  Nursing note and vitals reviewed.   ED Course  Procedures (including critical care time) Labs Review Labs Reviewed  COMPREHENSIVE METABOLIC PANEL - Abnormal; Notable for the following:    GFR calc non Af Amer 70 (*)    GFR calc Af Amer 81 (*)    Anion gap 4 (*)    All other components within normal limits  URINALYSIS, ROUTINE W REFLEX MICROSCOPIC - Abnormal; Notable for the following:    APPearance CLOUDY (*)    All other components within normal limits  RAPID STREP SCREEN  CULTURE, GROUP A STREP  CBC WITH DIFFERENTIAL/PLATELET  LIPASE, BLOOD    Imaging Review No results found.   EKG Interpretation None     Filed Vitals:   10/17/14 1905 10/17/14 2154 10/17/14 2331  BP: 106/58 99/58 112/71  Pulse: 79 62 79  Temp: 99.1 F (37.3 C)  98.3 F (36.8 C)  TempSrc: Oral  Oral  Resp: SpO2: 100% 96% 100%    MDM   Meds given in ED:  Medications  sodium chloride 0.9 % bolus 1,000 mL (0 mLs Intravenous Stopped 10/17/14 2335)  ondansetron (ZOFRAN) injection 4 mg (4 mg Intravenous Given 10/17/14 2142)    Discharge Medication List as  of 10/17/2014 11:27 PM      Final diagnoses:  Non-intractable vomiting with nausea, vomiting of unspecified type  Sore throat  Intermittent abdominal pain   This is a 24 y.o. female who is otherwise healthy who presents to the emergency department complaining of intermittent nausea and vomiting ongoing for the past 2 weeks. Patient also reports associated epigastric and left lower quadrant abdominal pain that began today. The patient reports vomiting one time today and reports there was bright red blood streaked in her vomit. She reports she vomits almost every day for the past 2 weeks. She reports nausea earlier but none currently. Patient reports a sore throat that started today but denies  trouble swallowing. She also reports intermittent positional lightheadedness. Patient is not able to identify factors that contributed to her nausea and vomiting. She reports the symptoms are sporadic and not related to eating or time of day. The patient was seen 2 days ago by her primary care provider for her 2 weeks of nausea and vomiting and intermittent lightheadedness. She was diagnosed with a UTI and started on Bactrim. She had a negative urine pregnancy 2 days ago. On exam she is afebrile and nontoxic appearing. She has very mild epigastric and left lower quadrant tenderness to palpation. No peritoneal signs. She has some slight tonsillar hypertrophy. Rapid strep is negative. Urinalysis is negative for infection. In fact her urinalysis 2 days ago does not appear to be infected. She denies any urinary symptoms. I'm not sure why this patient is diagnosed with a urinary tract infection 2 days ago. She is a normal lipase. Her CBC is within normal limits. Her CMP shows a GFR of 81 and some slight dehydration but is otherwise unremarkable.  At reevaluation the patient reports feeling better. She reports her abdominal pain is resolved. She denies any nausea and is tolerated by mouth liquids in the emergency department. She has not vomited since arrival to the ED. Patient not orthostatic and she has normal hemoglobin. Patient given fluid bolus and Zofran in the ED. The patient reports she stopped taking Effexor one week ago because she ran out, but this was one week after her symptoms began. I advised patient to keep a log of her symptoms, food and when they occur. She reports having nausea and pain medicine at home and does not wish for any prescriptions here today. I advised the patient to follow-up with their primary care provider this week. I advised the patient to return to the emergency department with new or worsening symptoms or new concerns. The patient verbalized understanding and agreement with plan.    This patient was discussed with Dr. Anitra LauthPlunkett who agrees with assessment and plan.    Everlene FarrierWilliam Fama Muenchow, PA-C 10/18/14 0202  Gwyneth SproutWhitney Plunkett, MD 10/20/14 [phone]

## 2014-10-21 LAB — CULTURE, GROUP A STREP: Strep A Culture: NEGATIVE

## 2015-08-31 IMAGING — CR DG CHEST 2V
2 series · 2 of 2 positions shown · non-contrast
Comparison: 02/26/2013

CLINICAL DATA: Shortness of breath

EXAM:
CHEST  2 VIEW

[w chest pa]
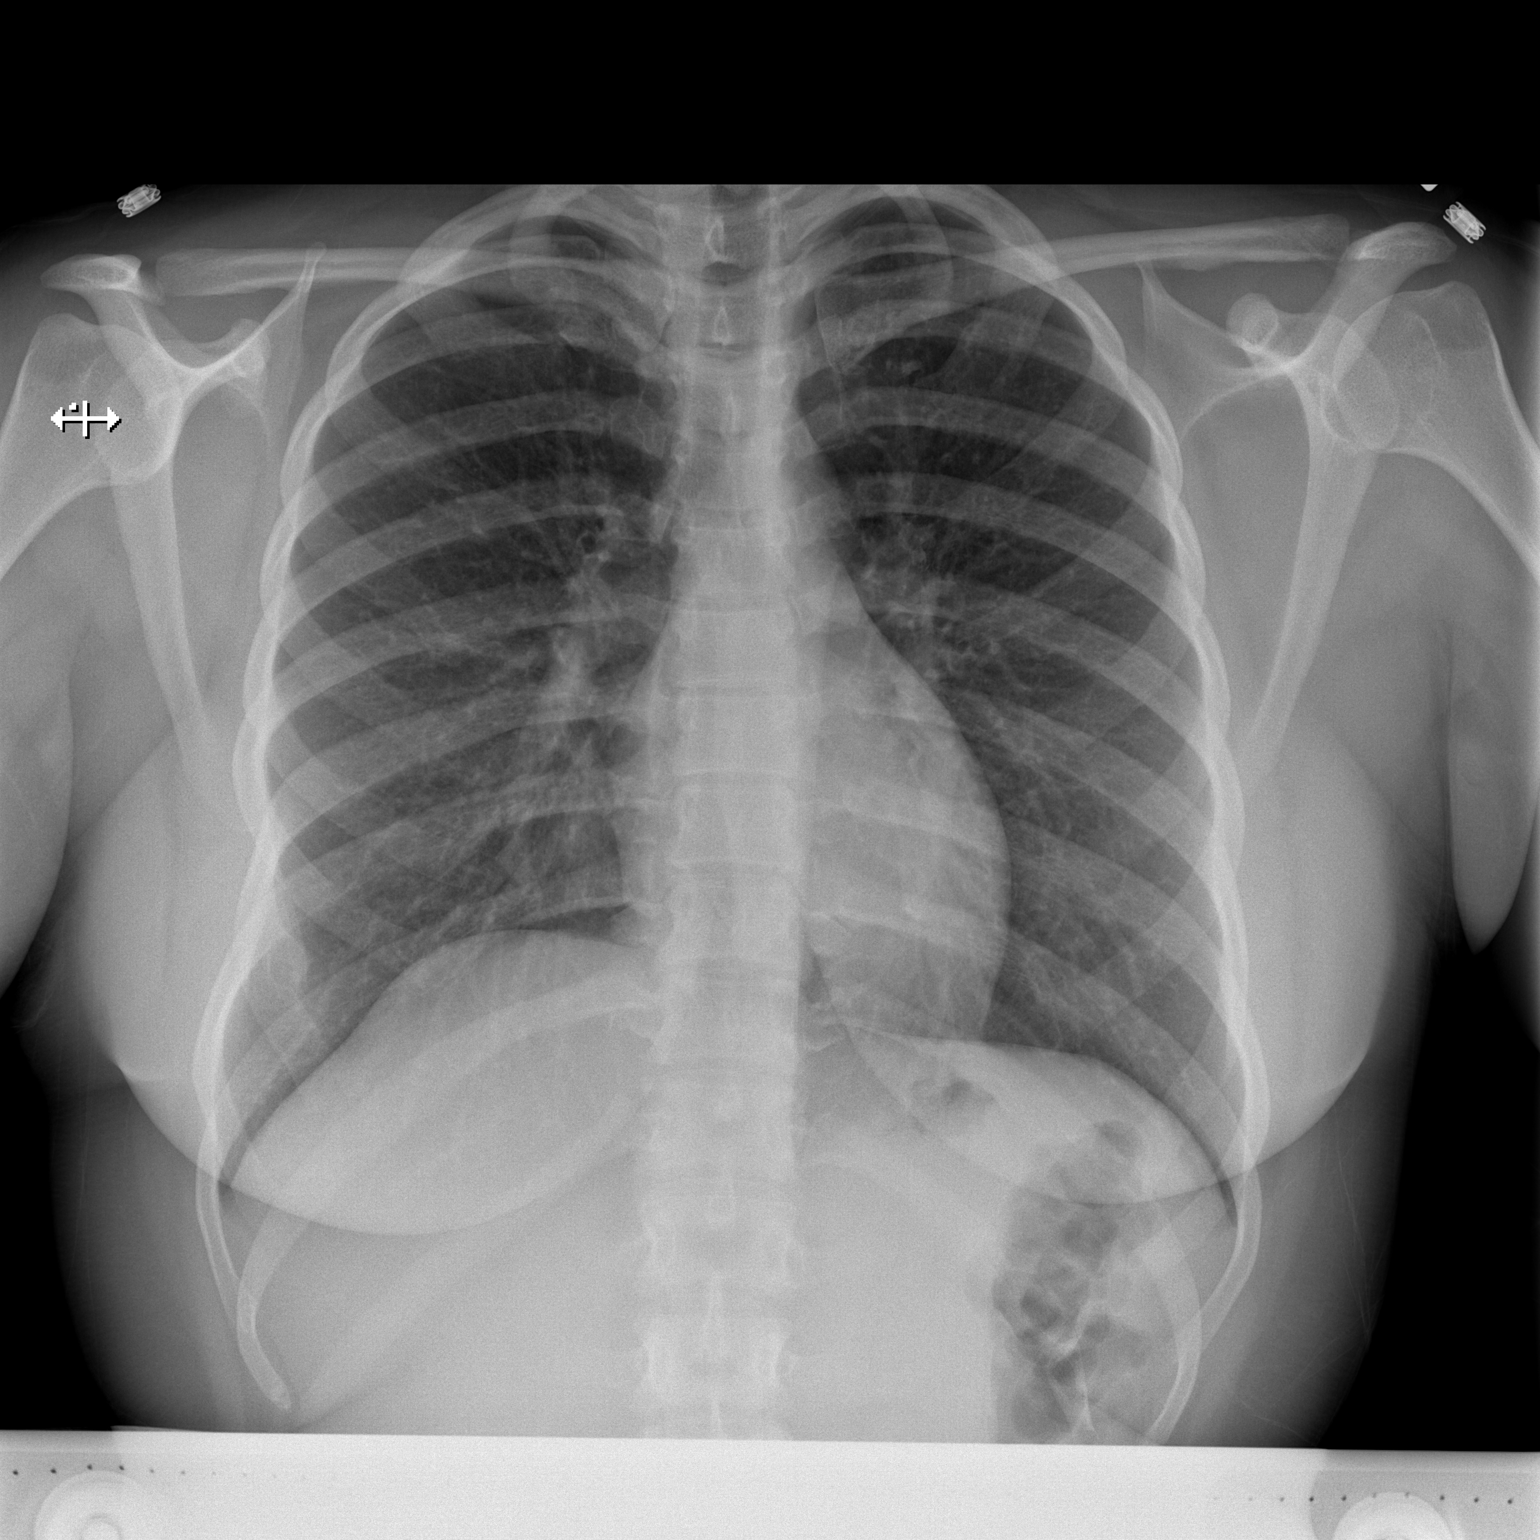

[w chest lat]
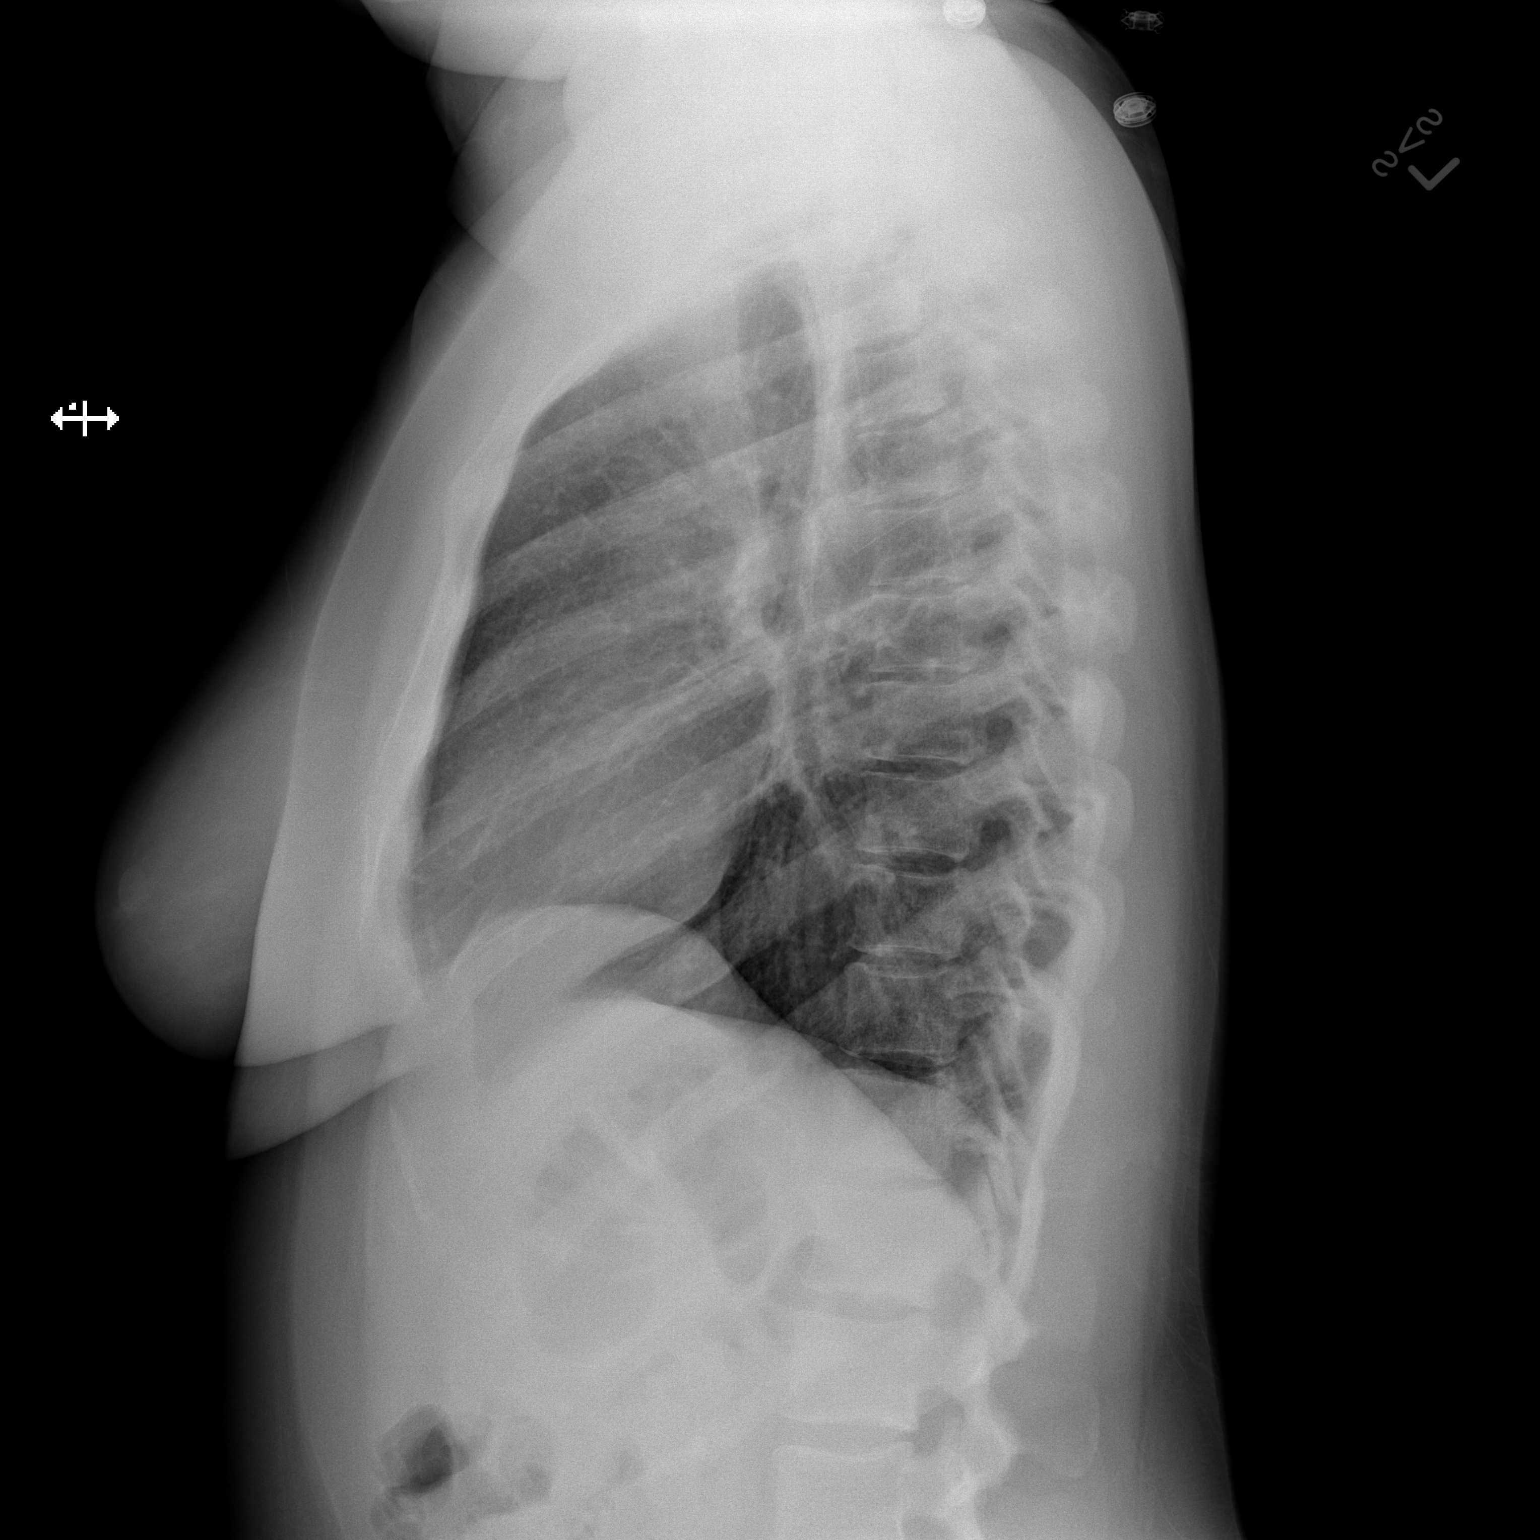

[2 of 2 positions shown; findings below may reference images not displayed]

FINDINGS: The heart size and mediastinal contours are within normal limits.
Both lungs are clear. The visualized skeletal structures are
unremarkable.
IMPRESSION: No active cardiopulmonary disease.
